# Patient Record
Sex: Female | Born: 1980 | Hispanic: No | Marital: Married | Smoking: Never smoker
Health system: Southern US, Community
[De-identification: ages and names within clinical notes are randomized; demographics above are authoritative.]

## PROBLEM LIST (undated history)

## (undated) DIAGNOSIS — R7303 Prediabetes: Secondary | ICD-10-CM

## (undated) DIAGNOSIS — E559 Vitamin D deficiency, unspecified: Secondary | ICD-10-CM

## (undated) DIAGNOSIS — Z1231 Encounter for screening mammogram for malignant neoplasm of breast: Secondary | ICD-10-CM

## (undated) DIAGNOSIS — Z1159 Encounter for screening for other viral diseases: Principal | ICD-10-CM

## (undated) DIAGNOSIS — E538 Deficiency of other specified B group vitamins: Secondary | ICD-10-CM

## (undated) DIAGNOSIS — I10 Essential (primary) hypertension: Secondary | ICD-10-CM

---

## 2008-05-11 ENCOUNTER — Ambulatory Visit: Payer: Self-pay | Admitting: *Deleted

## 2008-05-11 ENCOUNTER — Ambulatory Visit: Payer: Self-pay | Admitting: Obstetrics and Gynecology

## 2008-05-11 ENCOUNTER — Inpatient Hospital Stay (HOSPITAL_COMMUNITY): Admission: AD | Admit: 2008-05-11 | Discharge: 2008-05-15 | Payer: Self-pay | Admitting: Obstetrics

## 2008-05-11 ENCOUNTER — Inpatient Hospital Stay (HOSPITAL_COMMUNITY): Admission: AD | Admit: 2008-05-11 | Discharge: 2008-05-11 | Payer: Self-pay | Admitting: Obstetrics

## 2011-04-30 NOTE — Op Note (Signed)
Chelsea Lawrence, Chelsea Lawrence             ACCOUNT NO.:  [id number]   MEDICAL RECORD NO.:  [id number]          PATIENT TYPE:  INP   LOCATION:  9130                          FACILITY:  [facility]   PHYSICIAN:  Tilda Burrow, M.D. DATE OF BIRTH:  10/10/1981   DATE OF PROCEDURE:  05/12/2008  DATE OF DISCHARGE:                               OPERATIVE REPORT   PREOPERATIVE DIAGNOSES:  1. Pregnancy at 39 weeks.  2. Active labor.  3. Cephalopelvic disproportion.   POSTOPERATIVE DIAGNOSES:  1. Pregnancy at 39 weeks, delivered.  2. Active labor.  3. Cephalopelvic disproportion.   PROCEDURE:  Primary low transverse cervical cesarean section.   SURGEON:  Tilda Burrow, MD   ASSISTANT:  None.   ANESTHESIA:  Epidural.   COMPLICATIONS:  None.   FINDINGS:  A healthy female infant, Apgars 9 and 9.  Weight __________ .   INDICATIONS:  This primiparous female was admitted midday on the 27th  with cervical dilation of 5 cm, 100%, 0 station, progressed to  completely dilated, was given several hours to push the baby without  significant progress.  Epidural slide was tried as well as active  pushing and brought the baby down.  It was noted that the ischial spines  were extremely prominent with the bispinous diameter estimated at about  8 cm.   DETAILS OF PROCEDURE:  The patient was taken to the operating room.  Time-out was completed.  IV Ancef administered and transverse uterine  incision performed in a standard method of Pfannenstiel technique.  Peritoneal cavity was entered.  Bladder flap was quite high on the lower  uterine segment.  Transverse incision was made above the bladder,  extended laterally using index finger traction and the fetal vertex  rotated from the transverse position into the incision, and the baby  delivered with fundal pressure.  Cord was clamped and the infant was  passed to pediatricians who were in attendance.  Baby was in good shape.  Cord blood sample was obtained and  the placenta delivered intact,  Schultz presentation, membrane remnants required individual teasing out.  The uterus was inspected and incision looked hemostatic except for some  varicosities on the left side.  The left angle of the incision was  closed with an interrupted suture and tagged, and then the entire  uterine incision closed using running locking 0 chromic first layer and  running continuous 0 chromic second layer.  The patient tolerated the  procedure well and began to ask for food even this early in the case.  Bladder flap was subsequently loosely reapproximated with two  interrupted sutures of 2-0 chromic.  The anterior peritoneum closed with  running 2-0 chromic, the pyramidalis muscles loosely reapproximated with  interrupted suture of 2-0 chromic.  The fascia was then closed using  running 0  Vicryl and subcutaneous tissues inspected and confirmed as hemostatic,  reapproximated with three interrupted sutures of 2-0 plain and staple  closure of the skin completed the procedure.  Sponge and needle counts  were correct.  EBL 600 mL.      Tilda Burrow, M.D.  Electronically  Signed     JVF/MEDQ  D:  05/12/2008  T:  05/12/2008  Job:  [id number]

## 2011-09-11 LAB — CBC
HCT: 28.5 — ABNORMAL LOW
Hemoglobin: 13.9
Hemoglobin: 9.5 — ABNORMAL LOW
Hemoglobin: 9.5 — ABNORMAL LOW
MCHC: 33.3
MCV: 86.7
RBC: 3.29 — ABNORMAL LOW
RBC: 3.29 — ABNORMAL LOW
RBC: 4.82
WBC: 10.8 — ABNORMAL HIGH
WBC: 11.8 — ABNORMAL HIGH

## 2011-09-11 LAB — RPR: RPR Ser Ql: NONREACTIVE

## 2011-09-11 LAB — PLATELET COUNT: Platelets: 104 — ABNORMAL LOW

## 2011-11-26 LAB — OB RESULTS CONSOLE GC/CHLAMYDIA: Gonorrhea: NEGATIVE

## 2011-11-26 LAB — OB RESULTS CONSOLE ABO/RH

## 2011-11-26 LAB — OB RESULTS CONSOLE HEPATITIS B SURFACE ANTIGEN: Hepatitis B Surface Ag: NEGATIVE

## 2011-11-26 LAB — OB RESULTS CONSOLE RUBELLA ANTIBODY, IGM: Rubella: IMMUNE

## 2011-11-26 LAB — OB RESULTS CONSOLE HIV ANTIBODY (ROUTINE TESTING): HIV: NONREACTIVE

## 2012-07-17 ENCOUNTER — Encounter (HOSPITAL_COMMUNITY): Payer: Self-pay | Admitting: *Deleted

## 2012-07-17 ENCOUNTER — Inpatient Hospital Stay (HOSPITAL_COMMUNITY)
Admission: AD | Admit: 2012-07-17 | Discharge: 2012-07-17 | Disposition: A | Discharge status: Home / Self Care | Payer: Medicaid Other | Source: Ambulatory Visit | Attending: Obstetrics & Gynecology | Admitting: Obstetrics & Gynecology

## 2012-07-17 DIAGNOSIS — O479 False labor, unspecified: Secondary | ICD-10-CM | POA: Insufficient documentation

## 2012-07-17 DIAGNOSIS — O4690 Antepartum hemorrhage, unspecified, unspecified trimester: Secondary | ICD-10-CM | POA: Insufficient documentation

## 2012-07-17 NOTE — MAU Note (Signed)
Woke up this morning with bleeding.  Husband states not in labor, just bleeding.  2nd preg.  edc 08/05.  Was checked on Wed, was 1 cm.

## 2012-07-17 NOTE — MAU Note (Signed)
Little bit contractions.  Blood noted at 0500, spot now when went to bathroom, was mixed with mucous, passed a glob of blood.  Hx of c/s- labor was too long. Plan for trial of labor

## 2012-07-17 NOTE — MAU Note (Signed)
Initial US showed post complete previa.  No further comment noted on prenatal chart, regarding placenta.  Ongoing discussion with pt noted on chart regarding TOLAC.

## 2012-07-18 ENCOUNTER — Encounter (HOSPITAL_COMMUNITY)
Admission: AD | Disposition: A | Discharge status: Home / Self Care | Payer: Self-pay | Source: Ambulatory Visit | Attending: Obstetrics & Gynecology

## 2012-07-18 ENCOUNTER — Encounter (HOSPITAL_COMMUNITY): Payer: Self-pay | Admitting: Anesthesiology

## 2012-07-18 ENCOUNTER — Encounter (HOSPITAL_COMMUNITY): Payer: Self-pay | Admitting: *Deleted

## 2012-07-18 ENCOUNTER — Inpatient Hospital Stay (HOSPITAL_COMMUNITY)
Admission: AD | Admit: 2012-07-18 | Discharge: 2012-07-21 | DRG: 766 | Disposition: A | Discharge status: Home / Self Care | Payer: Medicaid Other | Source: Ambulatory Visit | Attending: Obstetrics & Gynecology | Admitting: Obstetrics & Gynecology

## 2012-07-18 ENCOUNTER — Inpatient Hospital Stay (HOSPITAL_COMMUNITY): Payer: Medicaid Other | Admitting: Anesthesiology

## 2012-07-18 DIAGNOSIS — Z302 Encounter for sterilization: Secondary | ICD-10-CM

## 2012-07-18 DIAGNOSIS — O34219 Maternal care for unspecified type scar from previous cesarean delivery: Secondary | ICD-10-CM | POA: Diagnosis present

## 2012-07-18 DIAGNOSIS — Z3480 Encounter for supervision of other normal pregnancy, unspecified trimester: Secondary | ICD-10-CM

## 2012-07-18 LAB — CBC
MCV: 84.7 fL (ref 78.0–100.0)
MCV: 85.3 fL (ref 78.0–100.0)
Platelets: 122 10*3/uL — ABNORMAL LOW (ref 150–400)
Platelets: 116 10*3/uL — ABNORMAL LOW (ref 150–400)
RBC: 4.37 MIL/uL (ref 3.87–5.11)
RBC: 3.89 MIL/uL (ref 3.87–5.11)
WBC: 10.5 10*3/uL (ref 4.0–10.5)
WBC: 10.5 10*3/uL (ref 4.0–10.5)

## 2012-07-18 LAB — RPR: RPR Ser Ql: NONREACTIVE

## 2012-07-18 SURGERY — Surgical Case
Anesthesia: Regional | Site: Abdomen | Wound class: Clean Contaminated

## 2012-07-18 MED ORDER — LACTATED RINGERS IV SOLN
500.0000 mL | INTRAVENOUS | Status: DC | PRN
  Administered 2012-07-18: 500 mL via INTRAVENOUS

## 2012-07-18 MED ORDER — ONDANSETRON HCL 4 MG/2ML IJ SOLN: 4.0000 mg | Freq: Four times a day (QID) | INTRAMUSCULAR | Status: DC | PRN

## 2012-07-18 MED ORDER — METOCLOPRAMIDE HCL 5 MG/ML IJ SOLN: 10.0000 mg | Freq: Three times a day (TID) | INTRAMUSCULAR | Status: DC | PRN

## 2012-07-18 MED ORDER — LACTATED RINGERS IV SOLN
INTRAVENOUS | Status: DC
  Administered 2012-07-18 (×4): via INTRAVENOUS

## 2012-07-18 MED ORDER — TERBUTALINE SULFATE 1 MG/ML IJ SOLN: 0.2500 mg | Freq: Once | INTRAMUSCULAR | Status: DC | PRN

## 2012-07-18 MED ORDER — SENNOSIDES-DOCUSATE SODIUM 8.6-50 MG PO TABS
2.0000 | ORAL_TABLET | Freq: Every day | ORAL | Status: DC
  Administered 2012-07-19 – 2012-07-20 (×2): 2 via ORAL

## 2012-07-18 MED ORDER — FENTANYL 2.5 MCG/ML BUPIVACAINE 1/10 % EPIDURAL INFUSION (WH - ANES)
INTRAMUSCULAR | Status: DC | PRN
Start: 2012-07-18 — End: 2012-07-18
  Administered 2012-07-18: 14 mL/h via EPIDURAL

## 2012-07-18 MED ORDER — KETOROLAC TROMETHAMINE 60 MG/2ML IM SOLN
INTRAMUSCULAR | Status: AC
  Administered 2012-07-18: 60 mg via INTRAMUSCULAR
  Filled 2012-07-18: qty 2

## 2012-07-18 MED ORDER — PHENYLEPHRINE 40 MCG/ML (10ML) SYRINGE FOR IV PUSH (FOR BLOOD PRESSURE SUPPORT): 80.0000 ug | PREFILLED_SYRINGE | INTRAVENOUS | Status: DC | PRN

## 2012-07-18 MED ORDER — IBUPROFEN 600 MG PO TABS
600.0000 mg | ORAL_TABLET | Freq: Four times a day (QID) | ORAL | Status: DC
  Administered 2012-07-18 – 2012-07-21 (×11): 600 mg via ORAL
  Filled 2012-07-18 (×11): qty 1

## 2012-07-18 MED ORDER — FENTANYL 2.5 MCG/ML BUPIVACAINE 1/10 % EPIDURAL INFUSION (WH - ANES)
14.0000 mL/h | INTRAMUSCULAR | Status: DC
  Administered 2012-07-18: 14 mL/h via EPIDURAL
  Filled 2012-07-18 (×2): qty 60

## 2012-07-18 MED ORDER — SCOPOLAMINE 1 MG/3DAYS TD PT72
1.0000 | MEDICATED_PATCH | Freq: Once | TRANSDERMAL | Status: AC
  Administered 2012-07-18: 1.5 mg via TRANSDERMAL

## 2012-07-18 MED ORDER — ZOLPIDEM TARTRATE 5 MG PO TABS: 5.0000 mg | ORAL_TABLET | Freq: Every evening | ORAL | Status: DC | PRN

## 2012-07-18 MED ORDER — PHENYLEPHRINE 40 MCG/ML (10ML) SYRINGE FOR IV PUSH (FOR BLOOD PRESSURE SUPPORT)
PREFILLED_SYRINGE | INTRAVENOUS | Status: AC
  Filled 2012-07-18: qty 5

## 2012-07-18 MED ORDER — OXYCODONE-ACETAMINOPHEN 5-325 MG PO TABS: 1.0000 | ORAL_TABLET | ORAL | Status: DC | PRN

## 2012-07-18 MED ORDER — LACTATED RINGERS IV SOLN
500.0000 mL | Freq: Once | INTRAVENOUS | Status: DC
Start: 2012-07-18 — End: 2012-07-18

## 2012-07-18 MED ORDER — SODIUM CHLORIDE 0.9 % IV SOLN: 1.0000 ug/kg/h | INTRAVENOUS | Status: DC | PRN

## 2012-07-18 MED ORDER — TETANUS-DIPHTH-ACELL PERTUSSIS 5-2.5-18.5 LF-MCG/0.5 IM SUSP: 0.5000 mL | Freq: Once | INTRAMUSCULAR | Status: DC

## 2012-07-18 MED ORDER — LIDOCAINE-EPINEPHRINE (PF) 2 %-1:200000 IJ SOLN
INTRAMUSCULAR | Status: AC
  Filled 2012-07-18: qty 20

## 2012-07-18 MED ORDER — OXYTOCIN 10 UNIT/ML IJ SOLN
40.0000 [IU] | INTRAVENOUS | Status: DC | PRN
  Administered 2012-07-18: 40 [IU] via INTRAVENOUS

## 2012-07-18 MED ORDER — KETOROLAC TROMETHAMINE 60 MG/2ML IM SOLN
60.0000 mg | Freq: Once | INTRAMUSCULAR | Status: AC | PRN
  Administered 2012-07-18: 60 mg via INTRAMUSCULAR

## 2012-07-18 MED ORDER — IBUPROFEN 600 MG PO TABS: 600.0000 mg | ORAL_TABLET | Freq: Four times a day (QID) | ORAL | Status: DC | PRN

## 2012-07-18 MED ORDER — WITCH HAZEL-GLYCERIN EX PADS: 1.0000 "application " | MEDICATED_PAD | CUTANEOUS | Status: DC | PRN

## 2012-07-18 MED ORDER — MEPERIDINE HCL 25 MG/ML IJ SOLN: 6.2500 mg | INTRAMUSCULAR | Status: DC | PRN

## 2012-07-18 MED ORDER — ONDANSETRON HCL 4 MG/2ML IJ SOLN
INTRAMUSCULAR | Status: AC
  Filled 2012-07-18: qty 2

## 2012-07-18 MED ORDER — ACETAMINOPHEN 325 MG PO TABS: 650.0000 mg | ORAL_TABLET | ORAL | Status: DC | PRN

## 2012-07-18 MED ORDER — DIPHENHYDRAMINE HCL 25 MG PO CAPS: 25.0000 mg | ORAL_CAPSULE | ORAL | Status: DC | PRN

## 2012-07-18 MED ORDER — MENTHOL 3 MG MT LOZG: 1.0000 | LOZENGE | OROMUCOSAL | Status: DC | PRN

## 2012-07-18 MED ORDER — SODIUM CHLORIDE 0.9 % IR SOLN
Status: DC | PRN
  Administered 2012-07-18: 1000 mL

## 2012-07-18 MED ORDER — EPHEDRINE 5 MG/ML INJ: 10.0000 mg | INTRAVENOUS | Status: DC | PRN

## 2012-07-18 MED ORDER — EPHEDRINE 5 MG/ML INJ
10.0000 mg | INTRAVENOUS | Status: DC | PRN
  Filled 2012-07-18: qty 4

## 2012-07-18 MED ORDER — PRENATAL MULTIVITAMIN CH
1.0000 | ORAL_TABLET | Freq: Every day | ORAL | Status: DC
  Administered 2012-07-19 – 2012-07-21 (×3): 1 via ORAL
  Filled 2012-07-18 (×3): qty 1

## 2012-07-18 MED ORDER — KETOROLAC TROMETHAMINE 30 MG/ML IJ SOLN: 30.0000 mg | Freq: Four times a day (QID) | INTRAMUSCULAR | Status: AC | PRN

## 2012-07-18 MED ORDER — MEPERIDINE HCL 25 MG/ML IJ SOLN
INTRAMUSCULAR | Status: DC | PRN
  Administered 2012-07-18: 25 mg via INTRAVENOUS

## 2012-07-18 MED ORDER — OXYTOCIN BOLUS FROM INFUSION
250.0000 mL | Freq: Once | INTRAVENOUS | Status: DC
  Filled 2012-07-18: qty 500

## 2012-07-18 MED ORDER — OXYCODONE-ACETAMINOPHEN 5-325 MG PO TABS
1.0000 | ORAL_TABLET | ORAL | Status: DC | PRN
  Administered 2012-07-19 – 2012-07-21 (×6): 1 via ORAL
  Filled 2012-07-18 (×7): qty 1

## 2012-07-18 MED ORDER — DIPHENHYDRAMINE HCL 50 MG/ML IJ SOLN: 25.0000 mg | INTRAMUSCULAR | Status: DC | PRN

## 2012-07-18 MED ORDER — OXYTOCIN 40 UNITS IN LACTATED RINGERS INFUSION - SIMPLE MED
1.0000 m[IU]/min | INTRAVENOUS | Status: DC
  Administered 2012-07-18: 2 m[IU]/min via INTRAVENOUS

## 2012-07-18 MED ORDER — LIDOCAINE HCL (PF) 1 % IJ SOLN: 30.0000 mL | INTRAMUSCULAR | Status: DC | PRN

## 2012-07-18 MED ORDER — ONDANSETRON HCL 4 MG/2ML IJ SOLN
INTRAMUSCULAR | Status: DC | PRN
  Administered 2012-07-18: 4 mg via INTRAVENOUS

## 2012-07-18 MED ORDER — LIDOCAINE-EPINEPHRINE (PF) 2 %-1:200000 IJ SOLN
INTRAMUSCULAR | Status: DC | PRN
  Administered 2012-07-18: 4 mL via EPIDURAL

## 2012-07-18 MED ORDER — LACTATED RINGERS IV SOLN
INTRAVENOUS | Status: DC
  Administered 2012-07-18: 125 mL/h via INTRAVENOUS

## 2012-07-18 MED ORDER — SODIUM CHLORIDE 0.9 % IJ SOLN: 3.0000 mL | INTRAMUSCULAR | Status: DC | PRN

## 2012-07-18 MED ORDER — MORPHINE SULFATE 0.5 MG/ML IJ SOLN
INTRAMUSCULAR | Status: AC
  Filled 2012-07-18: qty 10

## 2012-07-18 MED ORDER — NALBUPHINE HCL 10 MG/ML IJ SOLN
5.0000 mg | INTRAMUSCULAR | Status: DC | PRN
  Filled 2012-07-18: qty 1

## 2012-07-18 MED ORDER — SODIUM BICARBONATE 8.4 % IV SOLN
INTRAVENOUS | Status: AC
  Filled 2012-07-18: qty 50

## 2012-07-18 MED ORDER — OXYTOCIN 40 UNITS IN LACTATED RINGERS INFUSION - SIMPLE MED
62.5000 mL/h | Freq: Once | INTRAVENOUS | Status: DC
  Filled 2012-07-18: qty 1000

## 2012-07-18 MED ORDER — OXYTOCIN 40 UNITS IN LACTATED RINGERS INFUSION - SIMPLE MED
62.5000 mL/h | INTRAVENOUS | Status: AC
  Administered 2012-07-18: 62.5 mL/h via INTRAVENOUS
  Filled 2012-07-18 (×2): qty 1000

## 2012-07-18 MED ORDER — SIMETHICONE 80 MG PO CHEW
80.0000 mg | CHEWABLE_TABLET | ORAL | Status: DC | PRN
  Administered 2012-07-19: 80 mg via ORAL

## 2012-07-18 MED ORDER — MEPERIDINE HCL 25 MG/ML IJ SOLN
INTRAMUSCULAR | Status: AC
  Filled 2012-07-18: qty 1

## 2012-07-18 MED ORDER — LANOLIN HYDROUS EX OINT: 1.0000 "application " | TOPICAL_OINTMENT | CUTANEOUS | Status: DC | PRN

## 2012-07-18 MED ORDER — SCOPOLAMINE 1 MG/3DAYS TD PT72
MEDICATED_PATCH | TRANSDERMAL | Status: AC
  Filled 2012-07-18: qty 1

## 2012-07-18 MED ORDER — ONDANSETRON HCL 4 MG PO TABS: 4.0000 mg | ORAL_TABLET | ORAL | Status: DC | PRN

## 2012-07-18 MED ORDER — PHENYLEPHRINE HCL 10 MG/ML IJ SOLN
INTRAMUSCULAR | Status: DC | PRN
  Administered 2012-07-18: 40 ug via INTRAVENOUS
  Administered 2012-07-18 (×4): 80 ug via INTRAVENOUS
  Administered 2012-07-18 (×3): 40 ug via INTRAVENOUS

## 2012-07-18 MED ORDER — FLEET ENEMA 7-19 GM/118ML RE ENEM: 1.0000 | ENEMA | RECTAL | Status: DC | PRN

## 2012-07-18 MED ORDER — PHENYLEPHRINE 40 MCG/ML (10ML) SYRINGE FOR IV PUSH (FOR BLOOD PRESSURE SUPPORT)
80.0000 ug | PREFILLED_SYRINGE | INTRAVENOUS | Status: DC | PRN
  Filled 2012-07-18: qty 5

## 2012-07-18 MED ORDER — FENTANYL CITRATE 0.05 MG/ML IJ SOLN: 25.0000 ug | INTRAMUSCULAR | Status: DC | PRN

## 2012-07-18 MED ORDER — MORPHINE SULFATE (PF) 0.5 MG/ML IJ SOLN
INTRAMUSCULAR | Status: DC | PRN
  Administered 2012-07-18: 4 mg via EPIDURAL
  Administered 2012-07-18: 1 mg via INTRAVENOUS

## 2012-07-18 MED ORDER — DIBUCAINE 1 % RE OINT: 1.0000 "application " | TOPICAL_OINTMENT | RECTAL | Status: DC | PRN

## 2012-07-18 MED ORDER — CITRIC ACID-SODIUM CITRATE 334-500 MG/5ML PO SOLN
30.0000 mL | ORAL | Status: DC | PRN
  Administered 2012-07-18: 30 mL via ORAL
  Filled 2012-07-18 (×2): qty 15

## 2012-07-18 MED ORDER — NALOXONE HCL 0.4 MG/ML IJ SOLN: 0.4000 mg | INTRAMUSCULAR | Status: DC | PRN

## 2012-07-18 MED ORDER — ONDANSETRON HCL 4 MG/2ML IJ SOLN: 4.0000 mg | INTRAMUSCULAR | Status: DC | PRN

## 2012-07-18 MED ORDER — CEFAZOLIN SODIUM-DEXTROSE 2-3 GM-% IV SOLR
2.0000 g | INTRAVENOUS | Status: AC
  Administered 2012-07-18: 2 g via INTRAVENOUS
  Filled 2012-07-18: qty 50

## 2012-07-18 MED ORDER — DIPHENHYDRAMINE HCL 50 MG/ML IJ SOLN: 12.5000 mg | INTRAMUSCULAR | Status: DC | PRN

## 2012-07-18 MED ORDER — BUTORPHANOL TARTRATE 1 MG/ML IJ SOLN: 1.0000 mg | INTRAMUSCULAR | Status: DC | PRN

## 2012-07-18 MED ORDER — DIPHENHYDRAMINE HCL 25 MG PO CAPS: 25.0000 mg | ORAL_CAPSULE | Freq: Four times a day (QID) | ORAL | Status: DC | PRN

## 2012-07-18 MED ORDER — ONDANSETRON HCL 4 MG/2ML IJ SOLN: 4.0000 mg | Freq: Three times a day (TID) | INTRAMUSCULAR | Status: DC | PRN

## 2012-07-18 MED ORDER — SIMETHICONE 80 MG PO CHEW
80.0000 mg | CHEWABLE_TABLET | Freq: Three times a day (TID) | ORAL | Status: DC
  Administered 2012-07-19 – 2012-07-21 (×8): 80 mg via ORAL

## 2012-07-18 MED ORDER — CEFAZOLIN SODIUM-DEXTROSE 2-3 GM-% IV SOLR
INTRAVENOUS | Status: AC
  Filled 2012-07-18: qty 50

## 2012-07-18 MED ORDER — OXYTOCIN 10 UNIT/ML IJ SOLN
INTRAMUSCULAR | Status: AC
  Filled 2012-07-18: qty 4

## 2012-07-18 SURGICAL SUPPLY — 33 items
CLIP FILSHIE TUBAL LIGA STRL (Clip) ×2 IMPLANT
CLOTH BEACON ORANGE TIMEOUT ST (SAFETY) ×2 IMPLANT
CONTAINER PREFILL 10% NBF 15ML (MISCELLANEOUS) IMPLANT
DRSG COVADERM 4X10 (GAUZE/BANDAGES/DRESSINGS) ×2 IMPLANT
ELECT REM PT RETURN 9FT ADLT (ELECTROSURGICAL) ×2
ELECTRODE REM PT RTRN 9FT ADLT (ELECTROSURGICAL) ×1 IMPLANT
EXTRACTOR VACUUM M CUP 4 TUBE (SUCTIONS) IMPLANT
GLOVE ECLIPSE 6.0 STRL STRAW (GLOVE) ×2 IMPLANT
GLOVE ECLIPSE 6.5 STRL STRAW (GLOVE) ×2 IMPLANT
GOWN PREVENTION PLUS LG XLONG (DISPOSABLE) ×6 IMPLANT
KIT ABG SYR 3ML LUER SLIP (SYRINGE) IMPLANT
NEEDLE HYPO 25X5/8 SAFETYGLIDE (NEEDLE) IMPLANT
NS IRRIG 1000ML POUR BTL (IV SOLUTION) ×2 IMPLANT
PACK C SECTION WH (CUSTOM PROCEDURE TRAY) ×2 IMPLANT
PAD ABD 7.5X8 STRL (GAUZE/BANDAGES/DRESSINGS) ×2 IMPLANT
RTRCTR C-SECT PINK 25CM LRG (MISCELLANEOUS) ×2 IMPLANT
SLEEVE SCD COMPRESS KNEE MED (MISCELLANEOUS) ×2 IMPLANT
STAPLER VISISTAT 35W (STAPLE) ×2 IMPLANT
SUT PLAIN 0 NONE (SUTURE) IMPLANT
SUT VIC AB 0 CT1 27 (SUTURE) ×3
SUT VIC AB 0 CT1 27XBRD ANBCTR (SUTURE) ×3 IMPLANT
SUT VIC AB 1 CTX 36 (SUTURE) ×2
SUT VIC AB 1 CTX36XBRD ANBCTRL (SUTURE) ×2 IMPLANT
SUT VIC AB 3-0 CT1 27 (SUTURE) ×1
SUT VIC AB 3-0 CT1 TAPERPNT 27 (SUTURE) ×1 IMPLANT
SUT VIC AB 3-0 PS2 18 (SUTURE)
SUT VIC AB 3-0 PS2 18XBRD (SUTURE) IMPLANT
SUT VIC AB 3-0 SH 27 (SUTURE)
SUT VIC AB 3-0 SH 27X BRD (SUTURE) IMPLANT
TAPE CLOTH SURG 4X10 WHT LF (GAUZE/BANDAGES/DRESSINGS) ×2 IMPLANT
TOWEL OR 17X24 6PK STRL BLUE (TOWEL DISPOSABLE) ×4 IMPLANT
TRAY FOLEY CATH 14FR (SET/KITS/TRAYS/PACK) IMPLANT
WATER STERILE IRR 1000ML POUR (IV SOLUTION) ×2 IMPLANT

## 2012-07-18 NOTE — OR Nursing (Addendum)
Uterus massaged by S. Keeghan Mcintire Charity fundraiser. Two tubes of cord blood to lab. Foley catheter in place upon arrival to OR. Urine color-blood tinged.  25cc of blood evacuated from uterus during uterine massage.  Filshie clips applied to right and left fallopian tubes by Dr. Darrel Hoover. Lot number [id number]. Expires on 11/2014.  [employer]

## 2012-07-18 NOTE — Anesthesia Postprocedure Evaluation (Signed)
Anesthesia Post Note  Patient: Chelsea Lawrence  Procedure(s) Performed: Procedure(s) (LRB): CESAREAN SECTION (N/A)  Anesthesia type: Epidural  Patient location: PACU  Post pain: Pain level controlled  Post assessment: Post-op Vital signs reviewed  Last Vitals:  Filed Vitals:   07/18/12 1500  BP: 99/67  Pulse: 85  Temp:   Resp: 22    Post vital signs: stable  Level of consciousness: awake  Complications: No apparent anesthesia complications

## 2012-07-18 NOTE — Op Note (Addendum)
Patient Name: Chelsea Lawrence MRN: [id number]  Date of Surgery: 07/18/2012    PREOPERATIVE DIAGNOSIS: Failure to progress/ desires sterilization  POSTOPERATIVE DIAGNOSIS: Failure to progress/desires sterilization   PROCEDURE: Low transverse cesarean section  SURGEON: Caralyn Guile. Arlyce Dice M.D.  ANESTHESIA: Epidural  ESTIMATED BLOOD LOSS: 800 ml  FINDINGS: Female, Apgar 9,9; weight 7 lbs. 14 oz.; Clear fluid, normal adnexa   INDICATIONS: Failed TOLAC - 2nd stage arrest.  PROCEDURE IN DETAIL: The patient was taken to the operating room and the epidural that had been placed in labor was reinjected for surgical anesthesia.  She was then placed in the supine position with left lateral displacement of the uterus. The abdomen was prepped and draped in a sterile fashion and the bladder was catheterized.  A low transverse abdominal incision was made and carried down to the fascia. The fascia was opened transversely and the rectus sheath was dissected from the underlying rectus muscle. The rectus midline was identified and opened by sharp and blunt dissection. The peritoneum was opened. An Alexis retractor was placed and the lower uterine segment was identified, entered transversely by careful sharp dissection, and extended bluntly.  The infant was delivered without difficulty. The placenta was delivered with uterine message and cord traction and the uterus was bluntly curettaged. The lower segment was closed with running interlocking Vicryl 1 suture.  The left fallopian tube was identified and elevated with a Babcock clamp.  A Filshie clip was applied to the isthmic/ampullary portion.  The identical procedure was performed on the right tube to effect sterilization.  The peritoneum and rectus muscle were closed in the midline with running 3-0 Vicryl suture. The fascia was closed with running 0 Vicryl suture and the skin was closed with staples. All sponge and instrument counts were correct.  The patient  tolerated the procedure well and left the operating room in good condition.

## 2012-07-18 NOTE — Anesthesia Preprocedure Evaluation (Addendum)
Anesthesia Evaluation  Patient identified by MRN, date of birth, ID band Patient awake    Reviewed: Allergy & Precautions, H&P , Patient's Chart, lab work & pertinent test results  Airway Mallampati: II  TM Distance: >3 FB Neck ROM: full    Dental  (+) Teeth Intact   Pulmonary  breath sounds clear to auscultation        Cardiovascular Rhythm:regular Rate:Normal     Neuro/Psych    GI/Hepatic   Endo/Other    Renal/GU      Musculoskeletal   Abdominal   Peds  Hematology   Anesthesia Other Findings TOLAC      Reproductive/Obstetrics (+) Pregnancy                            Anesthesia Physical Anesthesia Plan  ASA: II  Anesthesia Plan: Epidural   Post-op Pain Management:    Induction:   Airway Management Planned:   Additional Equipment:   Intra-op Plan:   Post-operative Plan:   Informed Consent: I have reviewed the patients History and Physical, chart, labs and discussed the procedure including the risks, benefits and alternatives for the proposed anesthesia with the patient or authorized representative who has indicated his/her understanding and acceptance.   Dental Advisory Given  Plan Discussed with:   Anesthesia Plan Comments: (Labs checked- platelets confirmed with RN in room. Fetal heart tracing, per RN, reported to be stable enough for sitting procedure. Discussed epidural, and patient consents to the procedure:  included risk of possible headache,backache, failed block, allergic reaction, and nerve injury. This patient was asked if she had any questions or concerns before the procedure started.)        Anesthesia Quick Evaluation  

## 2012-07-18 NOTE — H&P (Signed)
31 y.o. G2P1001  Estimated Date of Delivery: 07/20/12 admitted at 39/[redacted] weeks gestation in early labor and SROM.  Prenatal Transfer Tool  Maternal Diabetes: No Genetic Screening: Normal Maternal Ultrasounds/Referrals: Normal Fetal Ultrasounds or other Referrals:  None Maternal Substance Abuse:  No Significant Maternal Medications:  None Significant Maternal Lab Results: None Other Significant Pregnancy Complications:  Previous cesarean delivery  Afebrile, VSS Heart and Lungs: No active disease Abdomen: soft, gravid, EFW AGA. Cervical exam:  2/70  Impression: TOLAC  Plan:  Attempt VBAC

## 2012-07-18 NOTE — Anesthesia Procedure Notes (Signed)

## 2012-07-18 NOTE — Transfer of Care (Signed)
Immediate Anesthesia Transfer of Care Note  Patient: Chelsea Lawrence  Procedure(s) Performed: Procedure(s) (LRB): CESAREAN SECTION (N/A)  Patient Location: PACU  Anesthesia Type: Epidural  Level of Consciousness: awake and alert   Airway & Oxygen Therapy: Patient Spontanous Breathing  Post-op Assessment: Report given to PACU RN  Post vital signs: Reviewed  Complications: No apparent anesthesia complications

## 2012-07-18 NOTE — Progress Notes (Signed)
Patient progressed rapidly to complete dilatation and has been pushing for 2 hrs and 15 minutes.  The vertex is +1/+2 and LOA.  She has not made much progress by my exam over the last hour.  It feels like her pubic arch is narrow.  Imp:  Previous C/S for second stage arrest.  I am not sure an attempt at operative delivery would be successful.  Pt desires sterilization.  I have told the pt that I feel repeat cesarean would be safest for mother and baby.  Plan:  Repeat C/S and Tubal sterilization.

## 2012-07-18 NOTE — MAU Note (Signed)
Pt states, " I had a gush of water leak out at 0020, and then my contractions got stronger and about every 3-5 minutes."

## 2012-07-18 NOTE — Progress Notes (Signed)
Requested orders for labor admission and epidrual

## 2012-07-18 NOTE — Preoperative (Signed)
Beta Blockers   Reason not to administer Beta Blockers:Not Applicable 

## 2012-07-19 LAB — CBC
HCT: 27 % — ABNORMAL LOW (ref 36.0–46.0)
MCHC: 32.6 g/dL (ref 30.0–36.0)
MCV: 84.6 fL (ref 78.0–100.0)
Platelets: 102 10*3/uL — ABNORMAL LOW (ref 150–400)
RDW: 17.8 % — ABNORMAL HIGH (ref 11.5–15.5)

## 2012-07-19 MED ORDER — FENTANYL 2.5 MCG/ML BUPIVACAINE 1/10 % EPIDURAL INFUSION (WH - ANES)
INTRAMUSCULAR | Status: AC
  Filled 2012-07-19: qty 60

## 2012-07-19 NOTE — Progress Notes (Signed)
Patient straight cathed using sterile technique resulting in 600 ml urine.

## 2012-07-19 NOTE — Anesthesia Postprocedure Evaluation (Signed)
  Anesthesia Post-op Note  Patient: Chelsea Lawrence  Procedure(s) Performed: Procedure(s) (LRB): CESAREAN SECTION (N/A)  Patient Location: Mother/Baby  Anesthesia Type: Epidural  Level of Consciousness: awake, alert  and oriented  Airway and Oxygen Therapy: Patient Spontanous Breathing  Post-op Pain: mild  Post-op Assessment: Patient's Cardiovascular Status Stable and Respiratory Function Stable  Post-op Vital Signs: Reviewed and stable  Complications: No apparent anesthesia complications

## 2012-07-19 NOTE — Progress Notes (Signed)
Notified Dr Arlyce Dice patient unable to void, he ordered bladder scan. Bladder scan results greater than 641 ml. Will straight cath as ordered.

## 2012-07-19 NOTE — Progress Notes (Signed)
Post Op Day 1 Subjective: Not voiding well - required straight cath at 11 am.  Otherwise patient doing well.  Objective: Blood pressure 82/51, pulse 79, temperature 97.2 F (36.2 C),  breastfeeding.  Physical Exam:  General: alert Lochia: appropriate Uterine Fundus: firm Incision: no significant drainage   Basename 07/19/12 0555 07/18/12 1449  HGB 8.8* 10.5*  HCT 27.0* 33.2*    Assessment/Plan: Plan for discharge tomorrow if patient feels well and voiding normally.   LOS: 1 day   Anaka Beazer D 07/19/2012, 11:00 AM

## 2012-07-19 NOTE — Addendum Note (Signed)
Addendum  created 07/19/12 1008 by Earmon Phoenix, CRNA   Modules edited:Notes Section

## 2012-07-19 NOTE — Progress Notes (Signed)
Notified Dr Arlyce Dice of BP results this morning and hemoglobin results. Patient not dizzy with ambulation, tolerates well.

## 2012-07-20 ENCOUNTER — Encounter (HOSPITAL_COMMUNITY): Payer: Self-pay | Admitting: Obstetrics & Gynecology

## 2012-07-20 NOTE — Progress Notes (Signed)
UR chart review completed.  

## 2012-07-20 NOTE — Progress Notes (Signed)
Subjective: Postpartum Day 2: Cesarean Delivery Patient reports feeling well. Notes more pain today, bleeding improved. No nausea.  Objective: Vital signs in last 24 hours: Filed Vitals:   07/19/12 0936 07/19/12 1437 07/19/12 2144 07/20/12 0607  BP: 82/51 78/51 89/54  98/70  Pulse: 79 98 96 90  Temp:  97.9 F (36.6 C) 98.3 F (36.8 C) 98 F (36.7 C)  TempSrc:   Oral Oral  Resp:  18 18 18   Height:      Weight:      SpO2:        Physical Exam:  General: alert, cooperative and appears stated age Lochia: appropriate Uterine Fundus: firm Incision: dressing intact    Basename 07/19/12 0555 07/18/12 1449  HGB 8.8* 10.5*  HCT 27.0* 33.2*    Assessment/Plan: Status post Cesarean section. Doing well postoperatively.  Continue current care. Encouraged patient to shower and remove bandage Desires D/C home tomorrow   Chelsea Lawrence H. 07/20/2012, 9:33 AM

## 2012-07-21 MED ORDER — OXYCODONE-ACETAMINOPHEN 5-325 MG PO TABS: 1.0000 | ORAL_TABLET | ORAL | Status: AC | PRN

## 2012-07-21 NOTE — Progress Notes (Signed)
POD#3 Pt without complaints. Ready for discharge.

## 2012-07-22 NOTE — Discharge Summary (Signed)
NAMEDOREATHER, HOXWORTH NO.:  [id number]  MEDICAL RECORD NO.:  [id number]  LOCATION:  9103                          FACILITY:  [facility]  PHYSICIAN:  Malva Limes, M.D.    DATE OF BIRTH:  29-Apr-1981  DATE OF ADMISSION:  07/18/2012 DATE OF DISCHARGE:  07/21/2012                              DISCHARGE SUMMARY   PRINCIPAL DISCHARGE DIAGNOSES: 1. Intrauterine pregnancy at term. 2. Spontaneous rupture of membranes. 3. History of prior cesarean section. 4. The patient desires attempted vaginal birth after cesarean section. 5. Patient desires permanent sterilization.  PRINCIPAL PROCEDURES: 1. Repeat low-transverse cesarean section. 2. Bilateral tubal ligation.  HISTORY OF PRESENT ILLNESS:  Ms. Belle is a 31 year old female, G2, P1-0- 0-1 at term, who presented to [facility] with spontaneous rupture of membranes.  The patient's prenatal care was uncomplicated.  The patient completed her first stage without difficulty.  She pushed for 2 hours and 15 minutes.  Despite aggressive pushing, she was unable to make significant progress.  She was taken to the operating room by Dr. Ilda Mori for repeat cesarean section secondary to arrest of second stage.  She also expressed desire for permanent sterilization.  A complete description of the operative procedure can be found in dictated operative note.  The patient's postoperative course was benign.  The patient postop hemoglobin was 8.8.  The patient was discharged on postop day #3.  At that time, she was eating a regular diet.  She was having flatus.  She had adequate pain control.  Her incision appeared to be healing well.  She was sent home with Percocet to take p.r.n. Instructed to follow up in the office in 4 weeks.          ______________________________ Malva Limes, M.D.     MA/MEDQ  D:  07/21/2012  T:  07/22/2012  Job:  [id number]

## 2014-07-27 ENCOUNTER — Encounter (HOSPITAL_COMMUNITY)
Admission: EM | Disposition: A | Discharge status: Home / Self Care | Payer: Self-pay | Source: Home / Self Care | Attending: Orthopedic Surgery

## 2014-07-27 ENCOUNTER — Emergency Department (HOSPITAL_COMMUNITY): Payer: No Typology Code available for payment source

## 2014-07-27 ENCOUNTER — Emergency Department (HOSPITAL_COMMUNITY): Payer: No Typology Code available for payment source | Admitting: Anesthesiology

## 2014-07-27 ENCOUNTER — Encounter (HOSPITAL_COMMUNITY): Payer: Self-pay | Admitting: Emergency Medicine

## 2014-07-27 ENCOUNTER — Ambulatory Visit (HOSPITAL_COMMUNITY)
Admission: EM | Admit: 2014-07-27 | Discharge: 2014-07-29 | Disposition: A | Discharge status: Home / Self Care | Payer: No Typology Code available for payment source | Attending: Orthopedic Surgery | Admitting: Orthopedic Surgery

## 2014-07-27 ENCOUNTER — Encounter (HOSPITAL_COMMUNITY): Payer: No Typology Code available for payment source | Admitting: Anesthesiology

## 2014-07-27 DIAGNOSIS — S8263XB Displaced fracture of lateral malleolus of unspecified fibula, initial encounter for open fracture type I or II: Principal | ICD-10-CM | POA: Insufficient documentation

## 2014-07-27 DIAGNOSIS — S99911A Unspecified injury of right ankle, initial encounter: Secondary | ICD-10-CM

## 2014-07-27 DIAGNOSIS — W1789XA Other fall from one level to another, initial encounter: Secondary | ICD-10-CM | POA: Insufficient documentation

## 2014-07-27 DIAGNOSIS — E861 Hypovolemia: Secondary | ICD-10-CM | POA: Insufficient documentation

## 2014-07-27 DIAGNOSIS — S82209A Unspecified fracture of shaft of unspecified tibia, initial encounter for closed fracture: Secondary | ICD-10-CM | POA: Diagnosis not present

## 2014-07-27 DIAGNOSIS — S82873B Displaced pilon fracture of unspecified tibia, initial encounter for open fracture type I or II: Secondary | ICD-10-CM | POA: Diagnosis present

## 2014-07-27 DIAGNOSIS — S82409A Unspecified fracture of shaft of unspecified fibula, initial encounter for closed fracture: Secondary | ICD-10-CM

## 2014-07-27 HISTORY — PX: ORIF ANKLE FRACTURE: SHX5408

## 2014-07-27 LAB — CBC WITH DIFFERENTIAL/PLATELET
BASOS ABS: 0 10*3/uL (ref 0.0–0.1)
BASOS PCT: 0 % (ref 0–1)
EOS PCT: 1 % (ref 0–5)
Eosinophils Absolute: 0.1 10*3/uL (ref 0.0–0.7)
HEMATOCRIT: 34.1 % — AB (ref 36.0–46.0)
Hemoglobin: 11.2 g/dL — ABNORMAL LOW (ref 12.0–15.0)
Lymphocytes Relative: 41 % (ref 12–46)
Lymphs Abs: 5.4 10*3/uL — ABNORMAL HIGH (ref 0.7–4.0)
MCH: 27.8 pg (ref 26.0–34.0)
MCHC: 32.8 g/dL (ref 30.0–36.0)
MCV: 84.6 fL (ref 78.0–100.0)
MONO ABS: 0.8 10*3/uL (ref 0.1–1.0)
MONOS PCT: 6 % (ref 3–12)
Neutro Abs: 6.8 10*3/uL (ref 1.7–7.7)
Neutrophils Relative %: 52 % (ref 43–77)
Platelets: 335 10*3/uL (ref 150–400)
RBC: 4.03 MIL/uL (ref 3.87–5.11)
RDW: 14.7 % (ref 11.5–15.5)
WBC: 13 10*3/uL — ABNORMAL HIGH (ref 4.0–10.5)

## 2014-07-27 LAB — BASIC METABOLIC PANEL
Anion gap: 16 — ABNORMAL HIGH (ref 5–15)
BUN: 13 mg/dL (ref 6–23)
CALCIUM: 8.3 mg/dL — AB (ref 8.4–10.5)
CO2: 20 mEq/L (ref 19–32)
Chloride: 104 mEq/L (ref 96–112)
Creatinine, Ser: 0.63 mg/dL (ref 0.50–1.10)
GFR calc Af Amer: >90 mL/min (ref >90)
GFR calc non Af Amer: >90 mL/min (ref >90)
Glucose, Bld: 137 mg/dL — ABNORMAL HIGH (ref 70–99)
Potassium: 2.9 mEq/L — CL (ref 3.7–5.3)
Sodium: 140 mEq/L (ref 137–147)

## 2014-07-27 LAB — ABO/RH: ABO/RH(D): A POS

## 2014-07-27 SURGERY — OPEN REDUCTION INTERNAL FIXATION (ORIF) ANKLE FRACTURE
Anesthesia: Regional | Site: Ankle | Laterality: Right

## 2014-07-27 MED ORDER — KETAMINE HCL 10 MG/ML IJ SOLN
2.0000 mg/kg | Freq: Once | INTRAMUSCULAR | Status: DC
  Filled 2014-07-27: qty 10.9

## 2014-07-27 MED ORDER — HYDROMORPHONE HCL PF 1 MG/ML IJ SOLN
0.5000 mg | Freq: Once | INTRAMUSCULAR | Status: AC
  Administered 2014-07-27: 0.5 mg via INTRAVENOUS
  Filled 2014-07-27: qty 1

## 2014-07-27 MED ORDER — BUPIVACAINE-EPINEPHRINE (PF) 0.5% -1:200000 IJ SOLN
INTRAMUSCULAR | Status: DC | PRN
  Administered 2014-07-27: 45 mL via PERINEURAL

## 2014-07-27 MED ORDER — FENTANYL CITRATE 0.05 MG/ML IJ SOLN
INTRAMUSCULAR | Status: DC | PRN
  Administered 2014-07-27 (×2): 25 ug via INTRAVENOUS
  Administered 2014-07-27: 100 ug via INTRAVENOUS

## 2014-07-27 MED ORDER — SODIUM CHLORIDE 0.9 % IV SOLN
Freq: Once | INTRAVENOUS | Status: AC
  Administered 2014-07-27: 1000 mL via INTRAVENOUS

## 2014-07-27 MED ORDER — POTASSIUM CHLORIDE 10 MEQ/100ML IV SOLN
10.0000 meq | INTRAVENOUS | Status: DC
  Administered 2014-07-27 (×3): 10 meq via INTRAVENOUS
  Filled 2014-07-27 (×3): qty 100

## 2014-07-27 MED ORDER — GENTAMICIN SULFATE 40 MG/ML IJ SOLN
INTRAMUSCULAR | Status: DC | PRN
  Administered 2014-07-27 (×2): 80 mg

## 2014-07-27 MED ORDER — VANCOMYCIN HCL 500 MG IV SOLR
INTRAVENOUS | Status: AC
  Filled 2014-07-27: qty 500

## 2014-07-27 MED ORDER — CEFAZOLIN SODIUM-DEXTROSE 2-3 GM-% IV SOLR
INTRAVENOUS | Status: DC | PRN
  Administered 2014-07-27: 2 g via INTRAVENOUS

## 2014-07-27 MED ORDER — MIDAZOLAM HCL 5 MG/5ML IJ SOLN
INTRAMUSCULAR | Status: DC | PRN
  Administered 2014-07-27: 2 mg via INTRAVENOUS

## 2014-07-27 MED ORDER — MIDAZOLAM HCL 2 MG/2ML IJ SOLN
INTRAMUSCULAR | Status: AC
  Filled 2014-07-27: qty 2

## 2014-07-27 MED ORDER — CEFAZOLIN SODIUM-DEXTROSE 2-3 GM-% IV SOLR
INTRAVENOUS | Status: AC
  Filled 2014-07-27: qty 50

## 2014-07-27 MED ORDER — KETAMINE HCL 10 MG/ML IJ SOLN
INTRAMUSCULAR | Status: AC | PRN
  Administered 2014-07-27: 55 mg via INTRAVENOUS
  Administered 2014-07-27: 25 mg via INTRAVENOUS

## 2014-07-27 MED ORDER — VECURONIUM BROMIDE 10 MG IV SOLR
INTRAVENOUS | Status: DC | PRN
  Administered 2014-07-27: 3 mg via INTRAVENOUS

## 2014-07-27 MED ORDER — ARTIFICIAL TEARS OP OINT
TOPICAL_OINTMENT | OPHTHALMIC | Status: DC | PRN
  Administered 2014-07-27: 1 via OPHTHALMIC

## 2014-07-27 MED ORDER — PHENYLEPHRINE HCL 10 MG/ML IJ SOLN
10.0000 mg | INTRAVENOUS | Status: DC | PRN
  Administered 2014-07-27: 25 ug/min via INTRAVENOUS

## 2014-07-27 MED ORDER — PHENYLEPHRINE HCL 10 MG/ML IJ SOLN
INTRAMUSCULAR | Status: DC | PRN
  Administered 2014-07-27: 40 ug via INTRAVENOUS
  Administered 2014-07-27: 80 ug via INTRAVENOUS
  Administered 2014-07-27: 40 ug via INTRAVENOUS
  Administered 2014-07-27 (×3): 80 ug via INTRAVENOUS

## 2014-07-27 MED ORDER — LIDOCAINE HCL (CARDIAC) 20 MG/ML IV SOLN
INTRAVENOUS | Status: DC | PRN
  Administered 2014-07-27: 100 mg via INTRAVENOUS

## 2014-07-27 MED ORDER — ONDANSETRON HCL 4 MG/2ML IJ SOLN
4.0000 mg | Freq: Once | INTRAMUSCULAR | Status: AC
  Administered 2014-07-27: 4 mg via INTRAVENOUS
  Filled 2014-07-27: qty 2

## 2014-07-27 MED ORDER — FENTANYL CITRATE 0.05 MG/ML IJ SOLN
INTRAMUSCULAR | Status: AC
  Filled 2014-07-27: qty 2

## 2014-07-27 MED ORDER — 0.9 % SODIUM CHLORIDE (POUR BTL) OPTIME
TOPICAL | Status: DC | PRN
Start: 2014-07-27 — End: 2014-07-28
  Administered 2014-07-27: 6000 mL
  Administered 2014-07-27 – 2014-07-28 (×4): 1000 mL

## 2014-07-27 MED ORDER — SUCCINYLCHOLINE CHLORIDE 20 MG/ML IJ SOLN
INTRAMUSCULAR | Status: AC
  Filled 2014-07-27: qty 1

## 2014-07-27 MED ORDER — HYDROMORPHONE HCL PF 1 MG/ML IJ SOLN
INTRAMUSCULAR | Status: AC
  Administered 2014-07-27: 0.5 mg
  Filled 2014-07-27: qty 1

## 2014-07-27 MED ORDER — DEXAMETHASONE SODIUM PHOSPHATE 4 MG/ML IJ SOLN
INTRAMUSCULAR | Status: AC
  Filled 2014-07-27: qty 1

## 2014-07-27 MED ORDER — FENTANYL CITRATE 0.05 MG/ML IJ SOLN
INTRAMUSCULAR | Status: AC
  Filled 2014-07-27: qty 5

## 2014-07-27 MED ORDER — ONDANSETRON HCL 4 MG/2ML IJ SOLN
INTRAMUSCULAR | Status: AC
  Filled 2014-07-27: qty 2

## 2014-07-27 MED ORDER — PROPOFOL 10 MG/ML IV BOLUS
INTRAVENOUS | Status: DC | PRN
  Administered 2014-07-27: 150 mg via INTRAVENOUS

## 2014-07-27 MED ORDER — LACTATED RINGERS IV SOLN
INTRAVENOUS | Status: DC | PRN
  Administered 2014-07-27 (×2): via INTRAVENOUS

## 2014-07-27 MED ORDER — FENTANYL CITRATE 0.05 MG/ML IJ SOLN
100.0000 ug | INTRAMUSCULAR | Status: AC
  Administered 2014-07-27: 100 ug via INTRAVENOUS

## 2014-07-27 MED ORDER — SUCCINYLCHOLINE CHLORIDE 20 MG/ML IJ SOLN
INTRAMUSCULAR | Status: DC | PRN
  Administered 2014-07-27: 100 mg via INTRAVENOUS

## 2014-07-27 MED ORDER — SODIUM CHLORIDE 0.9 % IV SOLN
INTRAVENOUS | Status: DC | PRN
  Administered 2014-07-27: 22:00:00 via INTRAVENOUS

## 2014-07-27 MED ORDER — VANCOMYCIN HCL 500 MG IV SOLR
INTRAVENOUS | Status: DC | PRN
  Administered 2014-07-27: 500 mg

## 2014-07-27 MED ORDER — ONDANSETRON HCL 4 MG/2ML IJ SOLN
4.0000 mg | Freq: Once | INTRAMUSCULAR | Status: AC
  Administered 2014-07-27: 4 mg via INTRAVENOUS

## 2014-07-27 MED ORDER — GLYCOPYRROLATE 0.2 MG/ML IJ SOLN
INTRAMUSCULAR | Status: AC
  Filled 2014-07-27: qty 2

## 2014-07-27 MED ORDER — DEXAMETHASONE SODIUM PHOSPHATE 4 MG/ML IJ SOLN
INTRAMUSCULAR | Status: DC | PRN
  Administered 2014-07-27: 4 mg via INTRAVENOUS

## 2014-07-27 MED ORDER — PROPOFOL 10 MG/ML IV BOLUS
INTRAVENOUS | Status: AC
Start: 2014-07-27 — End: 2014-07-27
  Filled 2014-07-27: qty 20

## 2014-07-27 MED ORDER — CEFAZOLIN SODIUM 1-5 GM-% IV SOLN
1.0000 g | Freq: Once | INTRAVENOUS | Status: AC
  Administered 2014-07-27: 1 g via INTRAVENOUS
  Filled 2014-07-27: qty 50

## 2014-07-27 MED ORDER — ROCURONIUM BROMIDE 50 MG/5ML IV SOLN
INTRAVENOUS | Status: AC
  Filled 2014-07-27: qty 1

## 2014-07-27 SURGICAL SUPPLY — 83 items
BANDAGE ELASTIC 4 VELCRO ST LF (GAUZE/BANDAGES/DRESSINGS) ×3 IMPLANT
BANDAGE ELASTIC 6 VELCRO ST LF (GAUZE/BANDAGES/DRESSINGS) ×6 IMPLANT
BIT DRILL QC 2.7 6.3IN  SHORT (BIT) ×2
BIT DRILL QC 2.7 6.3IN SHORT (BIT) ×1 IMPLANT
BLADE SURG 10 STRL SS (BLADE) IMPLANT
BNDG COHESIVE 6X5 TAN STRL LF (GAUZE/BANDAGES/DRESSINGS) IMPLANT
BNDG ESMARK 4X9 LF (GAUZE/BANDAGES/DRESSINGS) ×3 IMPLANT
BNDG GAUZE ELAST 4 BULKY (GAUZE/BANDAGES/DRESSINGS) IMPLANT
COVER MAYO STAND STRL (DRAPES) ×3 IMPLANT
COVER SURGICAL LIGHT HANDLE (MISCELLANEOUS) ×3 IMPLANT
CUFF TOURNIQUET SINGLE 34IN LL (TOURNIQUET CUFF) IMPLANT
CUFF TOURNIQUET SINGLE 44IN (TOURNIQUET CUFF) IMPLANT
DRAPE C-ARM 42X72 X-RAY (DRAPES) ×3 IMPLANT
DRAPE INCISE IOBAN 66X45 STRL (DRAPES) IMPLANT
DRAPE PROXIMA HALF (DRAPES) ×3 IMPLANT
DRAPE SURG 17X23 STRL (DRAPES) ×3 IMPLANT
DRAPE U-SHAPE 47X51 STRL (DRAPES) IMPLANT
DRILL CANNULA 2.7MM (BIT) ×3 IMPLANT
DRSG PAD ABDOMINAL 8X10 ST (GAUZE/BANDAGES/DRESSINGS) ×15 IMPLANT
DURAPREP 26ML APPLICATOR (WOUND CARE) IMPLANT
ELECT REM PT RETURN 9FT ADLT (ELECTROSURGICAL) ×3
ELECTRODE REM PT RTRN 9FT ADLT (ELECTROSURGICAL) ×1 IMPLANT
FACESHIELD WRAPAROUND (MASK) IMPLANT
GAUZE SPONGE 4X4 12PLY STRL (GAUZE/BANDAGES/DRESSINGS) ×6 IMPLANT
GAUZE SPONGE 4X4 16PLY XRAY LF (GAUZE/BANDAGES/DRESSINGS) ×6 IMPLANT
GAUZE XEROFORM 5X9 LF (GAUZE/BANDAGES/DRESSINGS) ×3 IMPLANT
GLOVE BIOGEL PI IND STRL 7.5 (GLOVE) ×1 IMPLANT
GLOVE BIOGEL PI IND STRL 8 (GLOVE) ×1 IMPLANT
GLOVE BIOGEL PI INDICATOR 7.5 (GLOVE) ×2
GLOVE BIOGEL PI INDICATOR 8 (GLOVE) ×2
GLOVE SURG ORTHO 8.0 STRL STRW (GLOVE) ×3 IMPLANT
GLOVE SURG SS PI 7.5 STRL IVOR (GLOVE) ×3 IMPLANT
GOWN STRL REUS W/ TWL LRG LVL3 (GOWN DISPOSABLE) ×3 IMPLANT
GOWN STRL REUS W/TWL LRG LVL3 (GOWN DISPOSABLE) ×6
GUIDE PIN 1.3 (Pin) ×6 IMPLANT
HANDPIECE INTERPULSE COAX TIP (DISPOSABLE)
KIT BASIN OR (CUSTOM PROCEDURE TRAY) ×3 IMPLANT
KIT ROOM TURNOVER OR (KITS) ×3 IMPLANT
KIT STIMULAN RAPID CURE 5CC (Orthopedic Implant) ×3 IMPLANT
MANIFOLD NEPTUNE II (INSTRUMENTS) IMPLANT
NEEDLE HYPO 25GX1X1/2 BEV (NEEDLE) ×3 IMPLANT
NS IRRIG 1000ML POUR BTL (IV SOLUTION) ×18 IMPLANT
PACK ORTHO EXTREMITY (CUSTOM PROCEDURE TRAY) ×3 IMPLANT
PAD ABD 8X10 STRL (GAUZE/BANDAGES/DRESSINGS) ×3 IMPLANT
PAD ARMBOARD 7.5X6 YLW CONV (MISCELLANEOUS) ×6 IMPLANT
PAD CAST 4YDX4 CTTN HI CHSV (CAST SUPPLIES) IMPLANT
PADDING CAST ABS 4INX4YD NS (CAST SUPPLIES) ×2
PADDING CAST ABS COTTON 4X4 ST (CAST SUPPLIES) ×1 IMPLANT
PADDING CAST COTTON 4X4 STRL (CAST SUPPLIES)
PIN GUIDE 1.3 (Pin) ×2 IMPLANT
PLATE FIBULA DIST 4H (Plate) ×3 IMPLANT
SCREW 3.5X36MM (Screw) ×3 IMPLANT
SCREW 3.5X40 (Screw) ×3 IMPLANT
SCREW 5.0X10 (Screw) ×3 IMPLANT
SCREW CANN 4.0X50 (Screw) ×2 IMPLANT
SCREW CANN 50X4 (Screw) ×1 IMPLANT
SCREW CANNULATED 4.0X60 (Screw) ×3 IMPLANT
SCREW LOCK 10X3.5XST NS (Screw) ×4 IMPLANT
SCREW LOCK 12X3.5XST PRLC (Screw) ×1 IMPLANT
SCREW LOCK 3.5X10 (Screw) ×8 IMPLANT
SCREW LOCK 3.5X12 (Screw) ×2 IMPLANT
SCREW LOCK 3.5X8 (Screw) ×6 IMPLANT
SCREW LOCKING 3.5X6 (Screw) ×3 IMPLANT
SCREW NL 3.5X14 (Screw) ×3 IMPLANT
SCREW NL 3.5X38MM (Screw) ×3 IMPLANT
SCREW NON LOCK 3.5X12 (Screw) ×9 IMPLANT
SET HNDPC FAN SPRY TIP SCT (DISPOSABLE) IMPLANT
SPLINT PLASTER CAST XFAST 5X30 (CAST SUPPLIES) ×1 IMPLANT
SPLINT PLASTER XFAST SET 5X30 (CAST SUPPLIES) ×2
SPONGE GAUZE 4X4 12PLY STER LF (GAUZE/BANDAGES/DRESSINGS) ×3 IMPLANT
STOCKINETTE IMPERVIOUS 9X36 MD (GAUZE/BANDAGES/DRESSINGS) IMPLANT
SUCTION FRAZIER TIP 10 FR DISP (SUCTIONS) ×3 IMPLANT
SUT ETHILON 3 0 PS 1 (SUTURE) ×24 IMPLANT
SUT VIC AB 2-0 CTB1 (SUTURE) ×6 IMPLANT
SUT VIC AB 3-0 SH 27 (SUTURE) ×4
SUT VIC AB 3-0 SH 27X BRD (SUTURE) ×2 IMPLANT
SYR CONTROL 10ML LL (SYRINGE) ×3 IMPLANT
TOWEL OR 17X24 6PK STRL BLUE (TOWEL DISPOSABLE) ×3 IMPLANT
TOWEL OR 17X26 10 PK STRL BLUE (TOWEL DISPOSABLE) ×9 IMPLANT
TUBE CONNECTING 12'X1/4 (SUCTIONS) ×1
TUBE CONNECTING 12X1/4 (SUCTIONS) ×2 IMPLANT
WATER STERILE IRR 1000ML POUR (IV SOLUTION) IMPLANT
YANKAUER SUCT BULB TIP NO VENT (SUCTIONS) IMPLANT

## 2014-07-27 NOTE — Anesthesia Preprocedure Evaluation (Signed)
Anesthesia Evaluation  Patient identified by MRN, date of birth, ID band Patient awake    Reviewed: Allergy & Precautions, H&P , NPO status , Patient's Chart, lab work & pertinent test results  Airway Mallampati: II  Neck ROM: full    Dental   Pulmonary neg pulmonary ROS,          Cardiovascular negative cardio ROS      Neuro/Psych    GI/Hepatic   Endo/Other    Renal/GU      Musculoskeletal   Abdominal   Peds  Hematology   Anesthesia Other Findings   Reproductive/Obstetrics                           Anesthesia Physical Anesthesia Plan  ASA: I  Anesthesia Plan: General and Regional   Post-op Pain Management: MAC Combined w/ Regional for Post-op pain   Induction: Intravenous  Airway Management Planned: Oral ETT  Additional Equipment:   Intra-op Plan:   Post-operative Plan: Extubation in OR  Informed Consent: I have reviewed the patients History and Physical, chart, labs and discussed the procedure including the risks, benefits and alternatives for the proposed anesthesia with the patient or authorized representative who has indicated his/her understanding and acceptance.     Plan Discussed with: CRNA, Anesthesiologist and Surgeon  Anesthesia Plan Comments:         Anesthesia Quick Evaluation  

## 2014-07-27 NOTE — Progress Notes (Signed)
[facility] Community Liaison Stacy, ° °Will be sending information on [employer] orange Card program, using the address provided.  °

## 2014-07-27 NOTE — ED Notes (Signed)
Pt at hotel. Thought her child was drowning in the pool, jumped over a 6 foot fence and landed on her right ankle. Pt has open ankle fracture. EMS wrapped wash cloth around ankle and placed leg immobilizer to right leg. Pt received of fentanyl en route to ER and pain is 3/10 mat this time. Pt alert and oriented x 4, neuro intact. +2 right pedal pulse, warm to touch, and pt able to wiggle toes.

## 2014-07-27 NOTE — Anesthesia Procedure Notes (Addendum)
Anesthesia Regional Block:  Popliteal block  Pre-Anesthetic Checklist: ,, timeout performed, Correct Patient, Correct Site, Correct Laterality, Correct Procedure, Correct Position, site marked, Risks and benefits discussed,  Surgical consent,  Pre-op evaluation,  At surgeon's request and post-op pain management  Laterality: Right  Prep: chloraprep       Needles:  Injection technique: Single-shot  Needle Type: Echogenic Stimulator Needle     Needle Length: 9cm 9 cm Needle Gauge: 21 and 21 G    Additional Needles:  Procedures: ultrasound guided (picture in chart) and nerve stimulator Popliteal block  Nerve Stimulator or Paresthesia:  Response: plantar flexion of foot, 0.45 mA,   Additional Responses:   Narrative:  Start time: 07/27/2014 9:33 PM End time: 07/27/2014 9:47 PM Injection made incrementally with aspirations every 5 mL.  Performed by: Personally  Anesthesiologist: Dr Chaney MallingHodierne  Additional Notes: Functioning IV was confirmed and monitors were applied.  A 90mm 21ga Arrow echogenic stimulator needle was used. Sterile prep and drape,hand hygiene and sterile gloves were used.  Negative aspiration and negative test dose prior to incremental administration of local anesthetic. 25 mL of 0.5% Bupivacaine +epi was injected around the popliteal nerve.  The patient tolerated the procedure well.  Ultrasound guidance: relevent anatomy identified, needle position confirmed, local anesthetic spread visualized around nerve(s), vascular puncture avoided.  Image printed for medical record.   Adductor canal block also done.  20 mL of 0.5% Bupivacaine + epi given in this location.  Procedure done sterily.     Procedure Name: Intubation Date/Time: 07/27/2014 10:12 PM Performed by: Luster LandsbergHASE, Yelitza Reach R Pre-anesthesia Checklist: Patient identified, Emergency Drugs available, Suction available and Patient being monitored Patient Re-evaluated:Patient Re-evaluated prior to inductionOxygen Delivery  Method: Circle system utilized Preoxygenation: Pre-oxygenation with 100% oxygen Intubation Type: IV induction, Rapid sequence and Cricoid Pressure applied Laryngoscope Size: Mac and 3 Grade View: Grade I Tube type: Oral Tube size: 7.5 mm Number of attempts: 1 Airway Equipment and Method: Stylet Placement Confirmation: ETT inserted through vocal cords under direct vision,  positive ETCO2 and breath sounds checked- equal and bilateral Secured at: 22 cm Tube secured with: Tape

## 2014-07-27 NOTE — ED Notes (Signed)
Patient transported to CT 

## 2014-07-27 NOTE — Progress Notes (Signed)
Orthopedic Tech Progress Note Patient Details:  Jonny Ruiznkita Prisk 05-25-1981 [id number]  Ortho Devices Type of Ortho Device: Ace wrap;Post (short leg) splint Ortho Device/Splint Location: RLE Ortho Device/Splint Interventions: Ordered;Application   Jennye MoccasinHughes, Lessie Manigo Craig 07/27/2014, 7:29 PM

## 2014-07-27 NOTE — ED Provider Notes (Signed)
CSN: [id number]     Arrival date & time 07/27/14  1550 History   First MD Initiated Contact with Patient 07/27/14 1551     Chief Complaint  Patient presents with  . Ankle Injury     (Consider location/radiation/quality/duration/timing/severity/associated sxs/prior Treatment) HPI  Chelsea Lawrence is a 33 y.o. female with noncontributory past medical history, presenting for a right ankle fracture. Patient was at a hotel pool where her child getting swimming lessons. Patient over heard a sound that she thought was her child in distress, possibly drowning, and ran jumping over a fence and landing on her right ankle on a slippery deck. Patient had immediate pain and deformity of her right ankle with significant bleeding. Upon arrival and the EMS, they wrapped the wound and sterile dressings. Patient was given 150 mcg of fentanyl in route by EMS. On arrival patient endorses sensation in the affected extremity, states that pain is controlled this time. She denies any other injuries, chest pain, shortness of breath, abdominal pain, or other associated symptoms.     History reviewed. No pertinent past medical history. History reviewed. No pertinent past surgical history. No family history on file. History  Substance Use Topics  . Smoking status: Not on file  . Smokeless tobacco: Not on file  . Alcohol Use: Not on file   OB History   Grav Para Term Preterm Abortions TAB SAB Ect Mult Living                 Review of Systems  Constitutional: Negative.   HENT: Negative.   Eyes: Negative.   Respiratory: Negative.   Cardiovascular: Negative.   Gastrointestinal: Negative.   Endocrine: Negative.   Genitourinary: Negative.   Musculoskeletal: Positive for joint swelling.  Skin: Positive for wound.  Allergic/Immunologic: Negative.   Neurological: Negative.   Hematological: Negative.   Psychiatric/Behavioral: Negative.       Allergies  Review of patient's allergies indicates no known  allergies.  Home Medications   Prior to Admission medications   Not on File   BP 102/53  Pulse 109  Temp(Src) 98 F (36.7 C) (Oral)  Resp 27  Ht 5\' 4"  (1.626 m)  Wt 120 lb (54.432 kg)  BMI 20.59 kg/m2  SpO2 100%  LMP 07/11/2014 Physical Exam  Nursing note and vitals reviewed. Constitutional: She is oriented to person, place, and time. She appears well-developed and well-nourished. She appears distressed.  HENT:  Head: Normocephalic and atraumatic.  Right Ear: External ear normal.  Left Ear: External ear normal.  Nose: Nose normal.  Mouth/Throat: Oropharynx is clear and moist. No oropharyngeal exudate.  Eyes: Conjunctivae and EOM are normal. Pupils are equal, round, and reactive to light. Right eye exhibits no discharge. Left eye exhibits no discharge. No scleral icterus.  Neck: Normal range of motion. Neck supple. No JVD present. No tracheal deviation present. No thyromegaly present.  Cardiovascular: Regular rhythm, normal heart sounds and intact distal pulses.  Tachycardia present.  Exam reveals no gallop and no friction rub.   No murmur heard. Pulmonary/Chest: Breath sounds normal. No stridor. Tachypnea noted. No respiratory distress. She has no decreased breath sounds. She has no wheezes. She has no rhonchi. She has no rales. She exhibits no tenderness.  Abdominal: Soft. She exhibits no distension.  Musculoskeletal: She exhibits no edema and no tenderness.       Right ankle: She exhibits decreased range of motion, swelling, ecchymosis, deformity and laceration. She exhibits normal pulse.       Feet:  Varus deformity of R ankle with exposed bone from lateral aspect and appearance of exposed cartilage.  4cm open laceration.  NVI.  Neurological: She is alert and oriented to person, place, and time. No cranial nerve deficit or sensory deficit. GCS eye subscore is 4. GCS verbal subscore is 5. GCS motor subscore is 6.  Skin: Skin is warm and dry. No rash noted. She is not  diaphoretic. No erythema. No pallor.  Psychiatric: She has a normal mood and affect. Her behavior is normal. Judgment and thought content normal.    ED Course  Procedures (including critical care time) Labs Review Labs Reviewed  BASIC METABOLIC PANEL - Abnormal; Notable for the following:    Potassium 2.9 (*)    Glucose, Bld 137 (*)    Calcium 8.3 (*)    Anion gap 16 (*)    All other components within normal limits  CBC WITH DIFFERENTIAL - Abnormal; Notable for the following:    WBC 13.0 (*)    Hemoglobin 11.2 (*)    HCT 34.1 (*)    Lymphs Abs 5.4 (*)    All other components within normal limits  TYPE AND SCREEN  ABO/RH    Imaging Review Dg Ankle 2 Views Right  07/27/2014   CLINICAL DATA:  Ankle injury with open fracture dislocation.  EXAM: RIGHT ANKLE - 2 VIEW  COMPARISON:  None.  FINDINGS: Two views were obtained. There are comminuted fractures of the distal tibia and fibula. Both fractures demonstrate marked medial displacement by greater than 3 cm. There is some overriding of the fracture fragments. Evaluation for involvement of the tibial plafond is limited on these views. The talus, subtalar joint and calcaneus appear grossly intact. There is significant soft tissue injury with emphysema and soft tissue swelling.  IMPRESSION: Open, significantly displaced comminuted fractures of the distal tibia and fibula as described. Intra-articular extension of the tibial fracture is difficult to evaluate. CT may be helpful for further assessment.   Electronically Signed   By: Roxy Horseman M.D.   On: 07/27/2014 16:44   Dg Ankle Complete Right  07/28/2014   CLINICAL DATA:  Internal fixation of right ankle fracture.  EXAM: RIGHT ANKLE - COMPLETE 3+ VIEW; DG C-ARM 61-120 MIN  COMPARISON:  Right ankle radiograph and CT performed 07/27/2014  FINDINGS: Three fluoroscopic C-arm images are provided from the OR. These demonstrate successful placement of a plate and screws across the distal fibula, and  screws across the medial tibial fracture, transfixing known fractures in grossly anatomic alignment. No new fractures are identified. The subtalar joint is grossly unremarkable in appearance.  IMPRESSION: Successful internal fixation of distal fibular and medial tibial fractures in grossly anatomic alignment.   Electronically Signed   By: Roanna Raider M.D.   On: 07/28/2014 02:21   Ct Ankle Right Wo Contrast  07/27/2014   CLINICAL DATA:  Open fracture dislocation of the right ankle.  EXAM: CT OF THE RIGHT ANKLE WITHOUT CONTRAST  TECHNIQUE: Multidetector CT imaging was performed according to the standard protocol. Multiplanar CT image reconstructions were also generated.  COMPARISON:  Radiographs dated 07/27/2014  FINDINGS: The dislocation has been reduced. The distal 3 cm of the fibula is displaced posterior to the talus and tibia and rotated. The distal tibiofibular syndesmosis appears to be intact.  There is a comminuted pilon fracture of the tibial plafond and. There is a sagittal fracture through the lateral aspect of the tibial plafond with an area of slight impaction measuring approximately 17 x  16 mm. There are multiple tiny fragments of bone within the ankle joint. There is a 1 mm step-off between the main portion of the tibial plafond and the medial malleolus fragment.  There is a small avulsion of bone from the anterior lateral aspect of the distal tibia.  There are small avulsions of bone from the proximal dorsal aspect of the navicular as well as a tiny avulsion from the dorsal proximal aspect of the cuboid and from the distal lateral aspect of the calcaneus.  The tendons around the ankle are intact. The peroneal tendons are deviated laterally by the displaced lateral malleolar fragment and this fragment does impinge upon the distal flexor hallucis longus muscle and the an peroneus brevis muscle.  There is air in the soft tissues and in the joint due to the open fracture.  The talus is intact.   IMPRESSION: 1. Pilon fracture of the distal tibia with slight impaction and slight distraction of the sagittal fracture of the distal tibia. 2. Multiple small bone fragments in the ankle joint. 3. Marked displacement of the lateral malleolus posteriorly as described. 4. Distal tibiofibular syndesmosis appears to be intact.   Electronically Signed   By: Geanie CooleyJim  Maxwell M.D.   On: 07/27/2014 20:18   Dg Ankle Right Port  07/28/2014   CLINICAL DATA:  Status post internal fixation of right ankle fractures.  EXAM: PORTABLE RIGHT ANKLE - 2 VIEW  COMPARISON:  Intraoperative radiographs performed earlier today at 1:06 a.m.  FINDINGS: Evaluation of the soft tissues is suboptimal due to the overlying cast.  There has been successful internal fixation of the patient's distal tibial and fibular fractures, with plate and screws. The fractures are noted in grossly anatomic alignment. There is slight dorsal displacement of the talus, without significant subluxation. The subtalar joint is grossly unremarkable in appearance. No new fractures are seen.  IMPRESSION: Successful internal fixation of distal tibial and fibular fractures in grossly anatomic alignment. Slight dorsal displacement of the talus noted.   Electronically Signed   By: Roanna RaiderJeffery  Chang M.D.   On: 07/28/2014 02:28   Dg C-arm 61-120 Min  07/28/2014   CLINICAL DATA:  Internal fixation of right ankle fracture.  EXAM: RIGHT ANKLE - COMPLETE 3+ VIEW; DG C-ARM 61-120 MIN  COMPARISON:  Right ankle radiograph and CT performed 07/27/2014  FINDINGS: Three fluoroscopic C-arm images are provided from the OR. These demonstrate successful placement of a plate and screws across the distal fibula, and screws across the medial tibial fracture, transfixing known fractures in grossly anatomic alignment. No new fractures are identified. The subtalar joint is grossly unremarkable in appearance.  IMPRESSION: Successful internal fixation of distal fibular and medial tibial fractures in  grossly anatomic alignment.   Electronically Signed   By: Roanna RaiderJeffery  Chang M.D.   On: 07/28/2014 02:21     EKG Interpretation None      MDM   Final diagnoses:  Ankle injury, right, initial encounter    Visible deformity to right ankle with gross appearance of fracture dislocation of tibiotalar joint. Will obtain x-rays, will wash out at bedside, continue to give additional pain management, and consult orthopedics. Patient is mildly hypertensive following administration of narcotics by EMS, and in the emergency department will give additional fluid boluses to allow for adequate pain control.  Patient's pressure stable with additional IV fluids, and pain control improved with extremity support. Dr. August Saucerean with orthopedics consulted and will perform a reduction at bedside in preparation for operative management.  Radiology imaging  reviewed as above.  Successful closed reduction at bedside under conscious sedation.  Will obtain CT of the right ankle for operative planning. Basic labs ordered as well. Note hypokalemia, IV potassium repletion ordered.  Patient to operating room with orthopedics for operative management.  Patient care was discussed with my attending, Dr. Jodi Mourning.    Gavin Pound, MD 07/28/14 [phone]

## 2014-07-27 NOTE — ED Notes (Signed)
Contacted CT for immediate CT scan of right ankle per ortho physician request.  Pt to go to OR soon.  CT reported they have one scanner down, will get her ASAP.  CN aware.

## 2014-07-27 NOTE — H&P (Signed)
Chelsea Lawrence is an 33 y.o. female.   Chief Complaint: Right ankle pain HPI: An Chelsea Lawrence is a 33 year old female who was jumping over fence when she landed in slipped and sustained a significant right ankle injury. She was brought to the ER by EMS. Antibiotics were given in the ER. This injury happened approximate 4 hours ago. She denies any other orthopedic complaints  History reviewed. No pertinent past medical history.  History reviewed. No pertinent past surgical history.  No family history on file. Social History:  has no tobacco, alcohol, and drug history on file.  Allergies: No Known Allergies   (Not in a hospital admission)  Results for orders placed during the hospital encounter of 07/27/14 (from the past 48 hour(s))  TYPE AND SCREEN     Status: None   Collection Time    07/27/14  3:59 PM      Result Value Ref Range   ABO/RH(D) A POS     Antibody Screen NEG     Sample Expiration 07/30/2014    ABO/RH     Status: None   Collection Time    07/27/14  3:59 PM      Result Value Ref Range   ABO/RH(D) A POS    BASIC METABOLIC PANEL     Status: Abnormal   Collection Time    07/27/14  4:30 PM      Result Value Ref Range   Sodium 140  137 - 147 mEq/L   Potassium 2.9 (*) 3.7 - 5.3 mEq/L   Comment: CRITICAL RESULT CALLED TO, READ BACK BY AND VERIFIED WITH:     Leretha Dykes [id number] [address] M   Chloride 104  96 - 112 mEq/L   CO2 20  19 - 32 mEq/L   Glucose, Bld 137 (*) 70 - 99 mg/dL   BUN 13  6 - 23 mg/dL   Creatinine, Ser 0.63  0.50 - 1.10 mg/dL   Calcium 8.3 (*) 8.4 - 10.5 mg/dL   GFR calc non Af Amer >90  >90 mL/min   GFR calc Af Amer >90  >90 mL/min   Comment: (NOTE)     The eGFR has been calculated using the CKD EPI equation.     This calculation has not been validated in all clinical situations.     eGFR's persistently <90 mL/min signify possible Chronic Kidney     Disease.   Anion gap 16 (*) 5 - 15  CBC WITH DIFFERENTIAL     Status: Abnormal   Collection  Time    07/27/14  4:30 PM      Result Value Ref Range   WBC 13.0 (*) 4.0 - 10.5 K/uL   RBC 4.03  3.87 - 5.11 MIL/uL   Hemoglobin 11.2 (*) 12.0 - 15.0 g/dL   HCT 34.1 (*) 36.0 - 46.0 %   MCV 84.6  78.0 - 100.0 fL   MCH 27.8  26.0 - 34.0 pg   MCHC 32.8  30.0 - 36.0 g/dL   RDW 14.7  11.5 - 15.5 %   Platelets 335  150 - 400 K/uL   Neutrophils Relative % 52  43 - 77 %   Neutro Abs 6.8  1.7 - 7.7 K/uL   Lymphocytes Relative 41  12 - 46 %   Lymphs Abs 5.4 (*) 0.7 - 4.0 K/uL   Monocytes Relative 6  3 - 12 %   Monocytes Absolute 0.8  0.1 - 1.0 K/uL   Eosinophils Relative 1  0 -  5 %   Eosinophils Absolute 0.1  0.0 - 0.7 K/uL   Basophils Relative 0  0 - 1 %   Basophils Absolute 0.0  0.0 - 0.1 K/uL   Dg Ankle 2 Views Right  07/27/2014   CLINICAL DATA:  Ankle injury with open fracture dislocation.  EXAM: RIGHT ANKLE - 2 VIEW  COMPARISON:  None.  FINDINGS: Two views were obtained. There are comminuted fractures of the distal tibia and fibula. Both fractures demonstrate marked medial displacement by greater than 3 cm. There is some overriding of the fracture fragments. Evaluation for involvement of the tibial plafond is limited on these views. The talus, subtalar joint and calcaneus appear grossly intact. There is significant soft tissue injury with emphysema and soft tissue swelling.  IMPRESSION: Open, significantly displaced comminuted fractures of the distal tibia and fibula as described. Intra-articular extension of the tibial fracture is difficult to evaluate. CT may be helpful for further assessment.   Electronically Signed   By: Camie Patience M.D.   On: 07/27/2014 16:44    Review of Systems  Constitutional: Negative.   HENT: Negative.   Eyes: Negative.   Respiratory: Negative.   Cardiovascular: Negative.   Gastrointestinal: Negative.   Genitourinary: Negative.   Musculoskeletal: Positive for joint pain.  Skin: Negative.   Neurological: Negative.   Endo/Heme/Allergies: Negative.    Psychiatric/Behavioral: Negative.     Blood pressure 102/61, pulse 104, temperature 98 F (36.7 C), temperature source Oral, resp. rate 26, height '5\' 4"'  (1.626 m), weight 54.432 kg (120 lb), last menstrual period 07/11/2014, SpO2 100.00%. Physical Exam  Constitutional: She appears well-developed.  HENT:  Head: Normocephalic.  Eyes: Pupils are equal, round, and reactive to light.  Neck: Normal range of motion.  Cardiovascular: Normal rate.   Respiratory: Effort normal.  Neurological: She is alert.  Skin: Skin is warm.  Psychiatric: She has a normal mood and affect.   right ankle demonstrates open laceration laterally measuring about 5 cm. Through this the distal tibia has protruded. Is not attached to the fibula. Laceration is oblique. Foot is perfused and sensate dorsal and plantar. Approximately 3 cm of the distal tibia protrudes from the laceration. She has denies any other orthopedic complaints in the upper or left lower extremity.  Assessment/Plan Impression is open fracture dislocation right ankle. In the emergency room after informed consent was obtained conscious sedation was utilized to obtain reduction. There was some pressure on the inferior flap of skin. This was reduced with the reduction.. Patient with normal sterile saline was performed both prior to and during the reduction maneuver. This is done under conscious sedation. Plan at this time is for CT scanning followed by washout tonight with antibody bead placement possible open reduction internal fixation versus plate fixation. Risk and benefits discussed with patient and family we will and to infection ankle stiffness nerve vessel damage wishes to persistent infection all questions answered plan for surgery tonight  DEAN,GREGORY SCOTT 07/27/2014, 7:09 PM

## 2014-07-28 ENCOUNTER — Emergency Department (HOSPITAL_COMMUNITY): Payer: No Typology Code available for payment source

## 2014-07-28 ENCOUNTER — Inpatient Hospital Stay (HOSPITAL_COMMUNITY): Payer: No Typology Code available for payment source

## 2014-07-28 ENCOUNTER — Encounter (HOSPITAL_COMMUNITY): Payer: Self-pay | Admitting: *Deleted

## 2014-07-28 DIAGNOSIS — S82873B Displaced pilon fracture of unspecified tibia, initial encounter for open fracture type I or II: Secondary | ICD-10-CM | POA: Diagnosis present

## 2014-07-28 LAB — PROTIME-INR
INR: 1.26 (ref 0.00–1.49)
Prothrombin Time: 15.8 seconds — ABNORMAL HIGH (ref 11.6–15.2)

## 2014-07-28 LAB — TYPE AND SCREEN
ABO/RH(D): A POS
Antibody Screen: NEGATIVE

## 2014-07-28 MED ORDER — METOCLOPRAMIDE HCL 10 MG PO TABS: 5.0000 mg | ORAL_TABLET | Freq: Three times a day (TID) | ORAL | Status: DC | PRN

## 2014-07-28 MED ORDER — GLYCOPYRROLATE 0.2 MG/ML IJ SOLN
INTRAMUSCULAR | Status: DC | PRN
  Administered 2014-07-28: 0.2 mg via INTRAVENOUS

## 2014-07-28 MED ORDER — OXYCODONE HCL 5 MG PO TABS
ORAL_TABLET | ORAL | Status: AC
  Filled 2014-07-28: qty 1

## 2014-07-28 MED ORDER — ONDANSETRON HCL 4 MG PO TABS: 4.0000 mg | ORAL_TABLET | Freq: Four times a day (QID) | ORAL | Status: DC | PRN

## 2014-07-28 MED ORDER — ONDANSETRON HCL 4 MG/2ML IJ SOLN: 4.0000 mg | Freq: Four times a day (QID) | INTRAMUSCULAR | Status: DC | PRN

## 2014-07-28 MED ORDER — DEXTROSE 5 % IV SOLN: 500.0000 mg | Freq: Four times a day (QID) | INTRAVENOUS | Status: DC | PRN

## 2014-07-28 MED ORDER — OXYCODONE HCL 5 MG/5ML PO SOLN: 5.0000 mg | Freq: Once | ORAL | Status: AC | PRN

## 2014-07-28 MED ORDER — NEOSTIGMINE METHYLSULFATE 10 MG/10ML IV SOLN
INTRAVENOUS | Status: DC | PRN
  Administered 2014-07-28: 2 mg via INTRAVENOUS

## 2014-07-28 MED ORDER — WARFARIN VIDEO: Freq: Once | Status: DC

## 2014-07-28 MED ORDER — POTASSIUM CHLORIDE IN NACL 40-0.9 MEQ/L-% IV SOLN
INTRAVENOUS | Status: AC
  Administered 2014-07-28: 75 mL/h via INTRAVENOUS
  Filled 2014-07-28 (×3): qty 1000

## 2014-07-28 MED ORDER — WARFARIN SODIUM 7.5 MG PO TABS
7.5000 mg | ORAL_TABLET | Freq: Once | ORAL | Status: AC
  Administered 2014-07-28: 7.5 mg via ORAL
  Filled 2014-07-28: qty 1

## 2014-07-28 MED ORDER — METOCLOPRAMIDE HCL 5 MG/ML IJ SOLN: 5.0000 mg | Freq: Three times a day (TID) | INTRAMUSCULAR | Status: DC | PRN

## 2014-07-28 MED ORDER — MEPERIDINE HCL 25 MG/ML IJ SOLN
6.2500 mg | INTRAMUSCULAR | Status: DC | PRN
  Administered 2014-07-28: 12.5 mg via INTRAVENOUS

## 2014-07-28 MED ORDER — HYDROMORPHONE HCL PF 1 MG/ML IJ SOLN: 0.2500 mg | INTRAMUSCULAR | Status: DC | PRN

## 2014-07-28 MED ORDER — CEFAZOLIN SODIUM 1-5 GM-% IV SOLN
1.0000 g | Freq: Three times a day (TID) | INTRAVENOUS | Status: AC
Start: 2014-07-28 — End: 2014-07-29
  Administered 2014-07-28 – 2014-07-29 (×5): 1 g via INTRAVENOUS
  Filled 2014-07-28 (×5): qty 50

## 2014-07-28 MED ORDER — MORPHINE SULFATE 2 MG/ML IJ SOLN
1.0000 mg | INTRAMUSCULAR | Status: DC | PRN
  Administered 2014-07-28 – 2014-07-29 (×3): 1 mg via INTRAVENOUS
  Filled 2014-07-28 (×3): qty 1

## 2014-07-28 MED ORDER — ONDANSETRON HCL 4 MG/2ML IJ SOLN
INTRAMUSCULAR | Status: DC | PRN
  Administered 2014-07-28: 4 mg via INTRAVENOUS

## 2014-07-28 MED ORDER — DOCUSATE SODIUM 100 MG PO CAPS
100.0000 mg | ORAL_CAPSULE | Freq: Two times a day (BID) | ORAL | Status: DC
  Administered 2014-07-28 – 2014-07-29 (×3): 100 mg via ORAL
  Filled 2014-07-28 (×3): qty 1

## 2014-07-28 MED ORDER — MEPERIDINE HCL 25 MG/ML IJ SOLN
INTRAMUSCULAR | Status: AC
  Filled 2014-07-28: qty 1

## 2014-07-28 MED ORDER — WARFARIN - PHARMACIST DOSING INPATIENT: Freq: Every day | Status: DC

## 2014-07-28 MED ORDER — COUMADIN BOOK
Freq: Once | Status: AC
  Administered 2014-07-28: 17:00:00
  Filled 2014-07-28: qty 1

## 2014-07-28 MED ORDER — OXYCODONE HCL 5 MG PO TABS
5.0000 mg | ORAL_TABLET | Freq: Once | ORAL | Status: AC | PRN
  Administered 2014-07-28: 5 mg via ORAL

## 2014-07-28 MED ORDER — METHOCARBAMOL 500 MG PO TABS
500.0000 mg | ORAL_TABLET | Freq: Four times a day (QID) | ORAL | Status: DC | PRN
  Administered 2014-07-28: 500 mg via ORAL
  Filled 2014-07-28: qty 1

## 2014-07-28 MED ORDER — METHOCARBAMOL 500 MG PO TABS
ORAL_TABLET | ORAL | Status: AC
  Filled 2014-07-28: qty 1

## 2014-07-28 MED ORDER — OXYCODONE HCL 5 MG PO TABS
5.0000 mg | ORAL_TABLET | ORAL | Status: DC | PRN
Start: 2014-07-28 — End: 2014-07-29
  Administered 2014-07-28 – 2014-07-29 (×5): 10 mg via ORAL
  Filled 2014-07-28 (×5): qty 2

## 2014-07-28 NOTE — Transfer of Care (Signed)
Immediate Anesthesia Transfer of Care Note  Patient: Chelsea Lawrence  Procedure(s) Performed: Procedure(s): OPEN REDUCTION INTERNAL FIXATION (ORIF) ANKLE FRACTURE (Right)  Patient Location: PACU  Anesthesia Type:General  Level of Consciousness: responds to stimulation  Airway & Oxygen Therapy: Patient Spontanous Breathing and Patient connected to nasal cannula oxygen  Post-op Assessment: Report given to PACU RN, Post -op Vital signs reviewed and stable and Patient moving all extremities  Post vital signs: Reviewed and stable  Complications: No apparent anesthesia complications

## 2014-07-28 NOTE — Progress Notes (Signed)
Occupational Therapy Evaluation Patient Details Name: Chelsea Lawrence MRN: [id number] DOB: 26-Aug-1981 Today's Date: 07/28/2014    History of Present Illness Chelsea Lawrence is a 33 y.o. Female s/p ORIF for Right ankle open lateral malleolus fracture after jumping over a fence and sustaining an injury.   Clinical Impression   PTA pt lived at home with her husband and was independent with ADLs and functional mobility. Pt declined OOB activities due to fatigue and nausea following PT session and performed grooming activities sitting in chair. Educated pt and family on tub/shower transfers. Pt may have access to handicapped room at hotel (where she lives) but is interested in tub transfer. Pt would benefit from skilled OT to address LB ADLs, tub transfer, and toilet transfer.      Follow Up Recommendations  No OT follow up;Supervision/Assistance - 24 hour    Equipment Recommendations  Other (comment) (TBD following tub transfer demo)       Precautions / Restrictions Precautions Precautions: None Restrictions Weight Bearing Restrictions: Yes RLE Weight Bearing: Non weight bearing      Mobility Bed Mobility Overal bed mobility: Needs Assistance Bed Mobility: Supine to Sit     Supine to sit: Min guard;HOB elevated     General bed mobility comments: Pt sitting up in recliner when OT arrived.   Transfers Overall transfer level: Needs assistance Equipment used:  (knee walker ) Transfers: Sit to/from BJ'sStand;Stand Pivot Transfers Sit to Stand: Min guard Stand pivot transfers: Min guard;Min assist       General transfer comment: Pt declined OOB activities. Will follow up to address this as available this afternoon.          ADL Overall ADL's : Needs assistance/impaired Eating/Feeding: Independent;Sitting   Grooming: Set up;Sitting;Oral care   Upper Body Bathing: Set up;Sitting   Lower Body Bathing: Minimal assistance;Sitting/lateral leans   Upper Body Dressing : Set  up;Sitting   Lower Body Dressing: Moderate assistance;Sitting/lateral leans                 General ADL Comments: Pt declined OOB activities this morning due to fatigue and nausea following PT session. Educated pt and family on compensatory techniques for LB ADLs. Discussed possibility of pt using handicapped shower at hotel, but family is also interested in tub transfer technique and will demo this at a later time. Educated pt on chair push ups to increase UE strength. Educated pt and family on fall prevention and energy conservation.      Vision  Pt reports no change from baseline. No apparent visual deficits.                    Perception Perception Perception Tested?: No   Praxis Praxis Praxis tested?: Within functional limits    Pertinent Vitals/Pain Pain Assessment: No/denies pain ("I'm fine")     Hand Dominance Right   Extremity/Trunk Assessment Upper Extremity Assessment Upper Extremity Assessment: Overall WFL for tasks assessed   Lower Extremity Assessment Lower Extremity Assessment: Defer to PT evaluation RLE Deficits / Details: Rt LE numb and grossly 2/5 in knee and hip RLE: Unable to fully assess due to immobilization RLE Sensation: decreased light touch (c/o numbness ) RLE Coordination: decreased fine motor;decreased gross motor   Cervical / Trunk Assessment Cervical / Trunk Assessment: Normal   Communication Communication Communication: Prefers language other than English;Other (comment) (pt does speak some English and sister can translate if neede)   Cognition Arousal/Alertness: Awake/alert Behavior During Therapy: WFL for tasks assessed/performed Overall  Cognitive Status: Within Functional Limits for tasks assessed                                Home Living Family/patient expects to be discharged to:: Private residence Living Arrangements: Spouse/significant other;Other relatives (sister) Available Help at Discharge:  Family;Available 24 hours/day Type of Home: Other(Comment) [facility]) Home Access: Elevator     Home Layout: One level     Bathroom Shower/Tub: Tub/shower unit (may have access to walk-in in handicapped room downstairs) Shower/tub characteristics: Curtain Firefighter: Standard     Home Equipment: Wheelchair - manual   Additional Comments: 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; reports they may be able to have access to handicap room for showering      Prior Functioning/Environment Level of Independence: Independent             OT Diagnosis: Generalized weakness;Acute pain   OT Problem List: Decreased strength;Decreased range of motion;Decreased activity tolerance;Impaired balance (sitting and/or standing);Decreased safety awareness;Decreased knowledge of use of DME or AE;Pain   OT Treatment/Interventions: Self-care/ADL training;Therapeutic exercise;Energy conservation;DME and/or AE instruction;Therapeutic activities;Patient/family education;Balance training    OT Goals(Current goals can be found in the care plan section) Acute Rehab OT Goals Patient Stated Goal: to not be sick  OT Goal Formulation: With patient/family Time For Goal Achievement: 08/04/14 Potential to Achieve Goals: Good ADL Goals Pt Will Perform Grooming: with modified independence;standing (with knee walker) Pt Will Perform Lower Body Bathing: with modified independence;with adaptive equipment;sit to/from stand Pt Will Perform Lower Body Dressing: with modified independence;with adaptive equipment;sit to/from stand Pt Will Transfer to Toilet: with modified independence;ambulating;bedside commode (with knee walker) Pt Will Perform Toileting - Clothing Manipulation and hygiene: with modified independence;sit to/from stand Pt Will Perform Tub/Shower Transfer: Tub transfer;Shower transfer;with modified independence;ambulating;tub bench;3 in 1 (with knee walker)  OT Frequency: Min 2X/week               End of Session  Activity Tolerance: Patient limited by fatigue;Other (comment) (limited by nausea) Patient left: in chair;with call bell/phone within reach;with family/visitor present   Time: 6962-9528 OT Time Calculation (min): 11 min Charges:  OT General Charges $OT Visit: 1 Procedure OT Evaluation $Initial OT Evaluation Tier I: 1 Procedure G-Codes: OT G-codes **NOT FOR INPATIENT CLASS** Functional Assessment Tool Used: clinical judgement Functional Limitation: Self care Self Care Current Status (U1324): At least 20 percent but less than 40 percent impaired, limited or restricted Self Care Goal Status (M0102): At least 1 percent but less than 20 percent impaired, limited or restricted   Rae Lips [phone] 07/28/2014, 11:43 AM

## 2014-07-28 NOTE — Progress Notes (Signed)
ANTICOAGULATION CONSULT NOTE - Initial Consult  Pharmacy Consult for Coumadin Indication: VTE prophylaxis  No Known Allergies  Patient Measurements: Height: 5\' 4"  (162.6 cm) Weight: 120 lb (54.432 kg) IBW/kg (Calculated) : 54.7  Vital Signs: Temp: 98 F (36.7 C) (08/13 0540) BP: 88/54 mmHg (08/13 0540) Pulse Rate: 109 (08/13 0540)  Labs:  Recent Labs  07/27/14 1630 07/28/14 0559  HGB 11.2*  --   HCT 34.1*  --   PLT 335  --   LABPROT  --  15.8*  INR  --  1.26  CREATININE 0.63  --     Estimated Creatinine Clearance: 87.5 ml/min (by C-G formula based on Cr of 0.63).   Medical History: History reviewed. No pertinent past medical history.  Medications:  No prescriptions prior to admission    Assessment: 33 y.o. female presents with R open ankle fracture - s/p ORIF 8/13. To begin coumadin tonight for VTE prophylaxis. INR 1.26 at baseline. CBC ok at baseline.   Goal of Therapy:  INR 2-3 Monitor platelets by anticoagulation protocol: Yes   Plan:  1. Daily INR 2. Coumadin 7.5mg  po today 3. Coumadin educational book and video  Christoper Fabianaron Brailynn Breth, PharmD, BCPS Clinical pharmacist, pager [phone] 07/28/2014,12:54 PM

## 2014-07-28 NOTE — Brief Op Note (Signed)
07/27/2014 - 07/28/2014  1:46 AM  PATIENT:  Chelsea RuizAnkita Lawrence  33 y.o. female  PRE-OPERATIVE DIAGNOSIS:  Open Ankle Fracture - Right  POST-OPERATIVE DIAGNOSIS:  Open Ankle Fracture - Right  PROCEDURE:  Procedure(s): OPEN REDUCTION INTERNAL FIXATION (ORIF) ANKLE FRACTURE pilon and lateral malleolus  SURGEON:  Surgeon(s): Cammy CopaGregory Scott Iesha Summerhill, MD  ASSISTANT: none  ANESTHESIA:   general  EBL: 50 ml    Total I/O In: 2700 [I.V.:2700] Out: 2700 [Urine:2700]  BLOOD ADMINISTERED: none  DRAINS: none   LOCAL MEDICATIONS USED:  none  SPECIMEN:  No Specimen  COUNTS:  YES  TOURNIQUET:    DICTATION: .Other Dictation: Dictation Number [id number]  PLAN OF CARE: Admit to inpatient   PATIENT DISPOSITION:  PACU - hemodynamically stable

## 2014-07-28 NOTE — Progress Notes (Signed)
Subjective: Pt stable - no return yet fo m/sensory function right foot   Objective: Vital signs in last 24 hours: Temp:  [98 F (36.7 C)-98.5 F (36.9 C)] 98 F (36.7 C) (08/13 0540) Pulse Rate:  [88-128] 109 (08/13 0540) Resp:  [16-36] 18 (08/13 0540) BP: (88-130)/(52-77) 88/54 mmHg (08/13 0540) SpO2:  [96 %-100 %] 98 % (08/13 0540) Weight:  [54.432 kg (120 lb)] 54.432 kg (120 lb) (08/13 0333)  Intake/Output from previous day: 08/12 0701 - 08/13 0700 In: 2700 [I.V.:2700] Out: 4400 [Urine:4400] Intake/Output this shift: Total I/O In: -  Out: 875 [Urine:875]  Exam:  foot perfused but not sensate or mobile  Labs:  Recent Labs  07/27/14 1630  HGB 11.2*    Recent Labs  07/27/14 1630  WBC 13.0*  RBC 4.03  HCT 34.1*  PLT 335    Recent Labs  07/27/14 1630  NA 140  K 2.9*  CL 104  CO2 20  BUN 13  CREATININE 0.63  GLUCOSE 137*  CALCIUM 8.3*    Recent Labs  07/28/14 0559  INR 1.26    Assessment/Plan: Plan coumadin tonight nwb - pain meds when block wears off - dc am   Denvil Canning SCOTT 07/28/2014, 12:10 PM

## 2014-07-28 NOTE — Progress Notes (Signed)
Occupational Therapy Treatment Patient Details Name: Chelsea Lawrence Keirsey MRN: [id number] DOB: [birth year] Today's Date: 07/28/2014    History of present illness Chelsea Lawrence Musa is a 33 y.o. Female s/p ORIF for Right ankle open lateral malleolus fracture after jumping over a fence and sustaining an injury.   OT comments  Pt seen this afternoon for functional mobility and LB ADLs. Pt ambulated into bathroom and practiced toilet transfer at min (A) level for management of RLE. Pt returned to bed following treatment. Educated pt and family on compensatory techniques and environmental modifications. Pt may have access to handicap shower and encouraged family to utilize this due to space needed in bathroom to manage knee walker. Pt making good progress with mobility, however c/o tingling in RLE as nerve block wears off.    Follow Up Recommendations  No OT follow up;Supervision/Assistance - 24 hour    Equipment Recommendations  Other (comment) (TBD following tub transfer)       Precautions / Restrictions Precautions Precautions: None Restrictions Weight Bearing Restrictions: Yes RLE Weight Bearing: Non weight bearing       Mobility Bed Mobility Overal bed mobility: Needs Assistance Bed Mobility: Sit to Supine       Sit to supine: Min assist   General bed mobility comments: Min (A) to manage RLE onto bed. VC's for sequencing and use of UE to scoot hips back onto bed and use bed rails.  Transfers Overall transfer level: Needs assistance Equipment used:  (knee walker) Transfers: Sit to/from [employer]Stand;Stand Pivot Transfers Sit to Stand: Min assist Stand pivot transfers: Min assist       General transfer comment: Min (A) to manage Rt LE and maintain NWB, however pt with good mobility and able to pivot easily.     Balance Overall balance assessment: Needs assistance Sitting-balance support: No upper extremity supported;Feet supported Sitting balance-Leahy Scale: Fair     Standing balance  support: Bilateral upper extremity supported;During functional activity Standing balance-Leahy Scale: Poor                     ADL Overall ADL's : Needs assistance/impaired     Grooming: Wash/dry hands;Min guard;Standing       Lower Body Bathing: Minimal assistance;Sit to/from stand       Lower Body Dressing: Minimal assistance;Sit to/from stand   Toilet Transfer: Minimal assistance;Ambulation;BSC;Cueing for sequencing (knee walker)           Functional mobility during ADLs: Min guard (knee walker)                  Cognition  Arousal/Alertness: Awake/Alert Behavior During Therapy: WFL for tasks assessed/performed Overall Cognitive Status: Within Functional Limits for tasks assessed                                    Pertinent Vitals/ Pain       Pain Assessment: 0-10 Pain Score: 6  Pain Location: R ankle "6 for pain, 8 for tingling" Pain Descriptors / Indicators: Tingling Pain Intervention(s): Limited activity within patient's tolerance;Monitored during session;RN gave pain meds during session;Relaxation         Frequency Min 2X/week     Progress Toward Goals  OT Goals(current goals can now be found in the care plan section)  Progress towards OT goals: Progressing toward goals     Plan Discharge plan remains appropriate       End of Session Equipment  Utilized During Treatment: Other (comment);Gait belt (knee walker)   Activity Tolerance Patient tolerated treatment well   Patient Left in bed;with call bell/phone within reach;with family/visitor present   Nurse Communication Patient requests pain meds    Functional Assessment Tool Used: clinical judgement Functional Limitation: Self care Self Care Current Status (Z6109): At least 20 percent but less than 40 percent impaired, limited or restricted Self Care Goal Status (U0454): At least 1 percent but less than 20 percent impaired, limited or restricted   Time: 1714-1738 OT  Time Calculation (min): 24 min  Charges: OT G-codes **NOT FOR INPATIENT CLASS** Functional Assessment Tool Used: clinical judgement Functional Limitation: Self care Self Care Current Status (U9811): At least 20 percent but less than 40 percent impaired, limited or restricted Self Care Goal Status (B1478): At least 1 percent but less than 20 percent impaired, limited or restricted OT General Charges $OT Visit: 1 Procedure OT Treatments $Self Care/Home Management : 23-37 mins  Rae Lips [phone] 07/28/2014, 5:50 PM

## 2014-07-28 NOTE — Evaluation (Addendum)
Physical Therapy Evaluation Patient Details Name: Chelsea Lawrence MRN: [id number] DOB: 20-Jun-1981 Today's Date: 07/28/2014   History of Present Illness  Chelsea Lawrence is a 33 y.o. Female s/p ORIF for Right ankle open lateral malleolus fracture after jumping over a fence and sustaining an injury.  Clinical Impression  Patient is s/p surgery listed above due to fall resulting in functional limitations due to the deficits listed below (see PT Problem List). Patient will benefit from skilled PT to increase their independence and safety with mobility to allow discharge to the venue listed below. Pt limited in mobility due to nausea. Anticipate good progress with therapy. Pt very supported by family. Pt may want to attempt ambulation with RW prior to D/C.      Follow Up Recommendations No PT follow up;Supervision/Assistance - 24 hour;Other (comment) (OPPT when WB status changes)    Equipment Recommendations  Other (comment) (knee walker; possible RW )    Recommendations for Other Services OT consult     Precautions / Restrictions Precautions Precautions: None Restrictions Weight Bearing Restrictions: Yes RLE Weight Bearing: Non weight bearing      Mobility  Bed Mobility Overal bed mobility: Needs Assistance Bed Mobility: Supine to Sit     Supine to sit: Min guard;HOB elevated     General bed mobility comments: min guard to guide Rt LE off EOB and control descent to ground; use of handrails for bed mobility  Transfers Overall transfer level: Needs assistance Equipment used:  (knee walker ) Transfers: Sit to/from [employer] Transfers Sit to Stand: Min guard Stand pivot transfers: Min guard;Min assist       General transfer comment: min (A) to manage Rt LE and maintain NWB status when pivoting off knee walker and to chair ; cues for sequencing   Ambulation/Gait Ambulation/Gait assistance: Min guard Ambulation Distance (Feet): 50 Feet Assistive device:  (knee walker  ) Gait Pattern/deviations: Step-to pattern Gait velocity: decr Gait velocity interpretation: Below normal speed for age/gender General Gait Details: pt able to ambulate with knee walker and min guard to balance; cues for sequencing and safety  Stairs            Wheelchair Mobility    Modified Rankin (Stroke Patients Only)       Balance Overall balance assessment: Needs assistance Sitting-balance support: Feet supported;No upper extremity supported Sitting balance-Leahy Scale: Fair Sitting balance - Comments: sat EOB ~ 5 min; denied any dizziness    Standing balance support: During functional activity;Bilateral upper extremity supported Standing balance-Leahy Scale: Poor Standing balance comment: relied on min guard (A) and knee walker to balance                              Pertinent Vitals/Pain Pain Assessment: No/denies pain ("I'm fine")    Home Living Family/patient expects to be discharged to:: Private residence   Available Help at Discharge: Family;Available 24 hours/day Type of Home: Other(Comment) [facility]) Home Access: Elevator     Home Layout: One level Home Equipment: Wheelchair - manual Additional Comments: 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; reports they may be able to get handicap room     Prior Function Level of Independence: Independent               Hand Dominance   Dominant Hand: Right    Extremity/Trunk Assessment   Upper Extremity Assessment: Overall WFL for tasks assessed  Lower Extremity Assessment: RLE deficits/detail RLE Deficits / Details: Rt LE numb and grossly 2/5 in knee and hip    Cervical / Trunk Assessment: Normal  Communication   Communication: Prefers language other than [place];Other (comment) (does speak little english and sister can translate if needed)  Cognition Arousal/Alertness: Awake/alert Behavior During Therapy: WFL for tasks assessed/performed Overall Cognitive Status:  Within Functional Limits for tasks assessed                      General Comments General comments (skin integrity, edema, etc.): spoke with pt and family extensively regarding D/C planning    Exercises        Assessment/Plan    PT Assessment Patient needs continued PT services  PT Diagnosis Difficulty walking;Generalized weakness;Acute pain   PT Problem List Decreased strength;Decreased activity tolerance;Decreased balance;Decreased mobility;Decreased knowledge of use of DME;Decreased safety awareness;Pain;Impaired sensation;Decreased range of motion  PT Treatment Interventions DME instruction;Gait training;Functional mobility training;Therapeutic activities;Therapeutic exercise;Balance training;Neuromuscular re-education;Patient/family education;Wheelchair mobility training   PT Goals (Current goals can be found in the Care Plan section) Acute Rehab PT Goals Patient Stated Goal: to not be sick  PT Goal Formulation: With patient/family Time For Goal Achievement: 08/01/14 Potential to Achieve Goals: Good    Frequency Min 4X/week   Barriers to discharge        Co-evaluation               End of Session Equipment Utilized During Treatment: Gait belt Activity Tolerance: Other (comment) (c/o nausea ) Patient left: in chair;with call bell/phone within reach;with family/visitor present Nurse Communication: Mobility status;Other (comment) (pt c/o nausea )    Functional Assessment Tool Used: clinical judgement Functional Limitation: Mobility: Walking and moving around Mobility: Walking and Moving Around Current Status [phone](G8978): At least 20 percent but less than 40 percent impaired, limited or restricted Mobility: Walking and Moving Around Goal Status [phone](G8979): At least 1 percent but less than 20 percent impaired, limited or restricted    Time: 0840-0906 PT Time Calculation (min): 26 min   Charges:   PT Evaluation $Initial PT Evaluation Tier I: 1 Procedure PT  Treatments $Gait Training: 8-22 mins   PT G Codes:   Functional Assessment Tool Used: clinical judgement Functional Limitation: Mobility: Walking and moving around    [place], WoodburnBrittany N, South CarolinaPT  [phone] 07/28/2014, 11:31 AM

## 2014-07-28 NOTE — Op Note (Signed)
NAMENATOSHIA, SOUTER NO.:  [id number]  MEDICAL RECORD NO.:  [id number]  LOCATION:  [facility]                         FACILITY:  [facility]  PHYSICIAN:  Burnard Bunting, M.D.    DATE OF BIRTH:  24-Oct-1981  DATE OF PROCEDURE: DATE OF DISCHARGE:                              OPERATIVE REPORT   PREOPERATIVE DIAGNOSES:  Right ankle open lateral malleolus fracture and pilon fracture.  POSTOPERATIVE DIAGNOSIS:  Right ankle open lateral malleolus fracture and pilon fracture.  PROCEDURE:  Right ankle extensive debridement of subcutaneous tissue, skin, muscle, fascia, bone along with open reduction and internal fixation, and reduction of lateral malleolar fracture with open reduction and internal fixation of pilon fracture with disimpaction of the articular surface and fixation.  Antibiotic beads placement also performed.  SURGEON:  Burnard Bunting, M.D.  ASSISTANT:  None.  ANESTHESIA:  General.  INDICATIONS:  Chelsea Lawrence is a patient with right ankle fracture presents for operative management after explanation of risks and benefits.  PROCEDURE IN DETAIL:  The patient was brought to the operating room where general endotracheal anesthesia was induced.  Preoperative antibiotics were administered.  Time-out was called.  Right leg was prepped with Hibiclens saline, draped in sterile manner.  The patient had an oblique incision over the distal aspect of the metaphyseal region of the fibula.  The open part of the incision was about 6 cm.  This ankle dislocated easily, medially.  This open laceration was extended proximally and distally parallel to the axis of the tibia.  Thorough irrigation of 2 L of irrigating solution was performed on the distal end of the tibia which was re-dislocated through the laceration. Disimpaction of the articular surfaces was then performed and autologous bone grafting from the shear fracture on the medial aspect of the tibia was then used to pack  the disimpacted articular surface piece.  This could be easily seen on the CT scan.  Once this bone grafting and disimpaction was performed, thorough excisional debridement was performed, devitalized skin, subcutaneous tissue, muscle, fascia, and bone were debrided with a curette.  At this time, following disimpaction of the articular surface of the joint itself was then thoroughly irrigated using about 2 more liters of irrigating solution.  The fracture was then reduced.  Bone hook was required to retrieve the distal fibula from the posterior aspect of the ankle joint.  Once this was done, the fibular fracture was reduced and a Smith and Nephew locking plate applied.  Fracture reduction was difficult due to the transverse nature of the fracture.  Locking plate was then chosen in order to maximize fixation distally with locking screws, proximally using cortical screws.  Provisional fixation was achieved and then attention was directed medially.  Under fluoroscopic guidance, the pilon fracture was reduced and then 2 screws were placed parallel to the articular surface.  These were bicortical screws, two 4.0 cannulated cancellous screws were then placed to the malleolus tip.  Again, under fluoroscopic guidance, this gave good reduction of the articular surface.  At this time, attention was redirected towards the lateral side.  The lateral malleolus with provisional fixation was not quite precisely reduced, it was somewhat  posterior.  This was noted on the lateral fluoroscopic views.  The position of the lateral malleolus was then improved by after removing the locking screws and shifting the lateral malleolus anteriorly about 5 mm.  Good reduction and cortical contact was achieved.  Repeat fixation with the locking screws with good fixation achieved was performed.  The ankle was then stressed and the syndesmosis was stable.  This is in concordance with the preop CT scan, which showed  reduction of the syndesmosis preoperatively.  At this time, again thorough irrigation performed on the lateral incision.  Antibiotic beads were placed.  That incision was then closed using interrupted inverted 3-0 Vicryl and 3-0 nylon suture.  Medial incision closed using 3-0 Vicryl and 3-0 nylon suture.  Bulky well-padded splint applied.  The patient tolerated the procedure well without immediate complications. Transferred to recovery room in stable condition.  PLAN:  Coumadin and SCDs for DVT prophylaxis.  Anticipate hospital stay tonight and tomorrow night.     Burnard BuntingG. Scott Elonzo Sopp, M.D.     GSD/MEDQ  D:  07/28/2014  T:  07/28/2014  Job:  [id number]

## 2014-07-29 LAB — PROTIME-INR
INR: 1.62 — ABNORMAL HIGH (ref 0.00–1.49)
Prothrombin Time: 19.2 seconds — ABNORMAL HIGH (ref 11.6–15.2)

## 2014-07-29 MED ORDER — ONDANSETRON HCL 4 MG PO TABS: 4.0000 mg | ORAL_TABLET | Freq: Four times a day (QID) | ORAL | Status: AC | PRN

## 2014-07-29 MED ORDER — METHOCARBAMOL 500 MG PO TABS: 500.0000 mg | ORAL_TABLET | Freq: Four times a day (QID) | ORAL | Status: AC | PRN

## 2014-07-29 MED ORDER — OXYCODONE HCL 5 MG PO TABS: 5.0000 mg | ORAL_TABLET | ORAL | Status: AC | PRN

## 2014-07-29 MED ORDER — DSS 100 MG PO CAPS: 100.0000 mg | ORAL_CAPSULE | Freq: Two times a day (BID) | ORAL | Status: AC

## 2014-07-29 MED ORDER — WARFARIN SODIUM 5 MG PO TABS: 5.0000 mg | ORAL_TABLET | Freq: Every day | ORAL | Status: AC

## 2014-07-29 NOTE — Progress Notes (Signed)
Utilization review completed. Taliesin Hartlage, RN, BSN. 

## 2014-07-29 NOTE — Progress Notes (Signed)
Subjective: Pt stable - pain controlled   Objective: Vital signs in last 24 hours: Temp:  [98.3 F (36.8 C)-100.1 F (37.8 C)] 99.6 F (37.6 C) (08/14 0542) Pulse Rate:  [96-116] 105 (08/14 0542) Resp:  [18] 18 (08/14 0542) BP: (93-110)/(60-62) 110/62 mmHg (08/14 0542) SpO2:  [98 %-100 %] 98 % (08/14 0542)  Intake/Output from previous day: 08/13 0701 - 08/14 0700 In: -  Out: 3945 [Urine:3945] Intake/Output this shift: Total I/O In: 100 [P.O.:100] Out: 1 [Urine:1]  Exam:  Sensation intact distally Intact pulses distally Dorsiflexion/Plantar flexion intact  Labs:  Recent Labs  07/27/14 1630  HGB 11.2*    Recent Labs  07/27/14 1630  WBC 13.0*  RBC 4.03  HCT 34.1*  PLT 335    Recent Labs  07/27/14 1630  NA 140  K 2.9*  CL 104  CO2 20  BUN 13  CREATININE 0.63  GLUCOSE 137*  CALCIUM 8.3*    Recent Labs  07/28/14 0559 07/29/14 0500  INR 1.26 1.62*    Assessment/Plan: Plan dc today will need hhc   Kareen Jefferys SCOTT 07/29/2014, 12:06 PM

## 2014-07-29 NOTE — Progress Notes (Signed)
Patient provided with discharge instructions and follow up information. She is going home with HHPT/RN through [facility]. Form to pick up DME knee walker from [facility] has been provided by case manager. She will be going home with husband and family after current IV antibiotic finishes.

## 2014-07-29 NOTE — Progress Notes (Signed)
Physical Therapy Treatment Patient Details Name: Chelsea Lawrence Hudlow MRN: [id number] DOB: 1981-11-09 Today's Date: 07/29/2014    History of Present Illness Chelsea Lawrence Ponciano is a 33 y.o. Female s/p ORIF for Right ankle open lateral malleolus fracture after jumping over a fence and sustaining an injury.    PT Comments    Pt able to progress mobility with knee walker. Also attempted ambulating with RW. Pt able to increase mobility distance with knee walker. Pt at supervision level for mobility and transfers at this time. Safe from mobility standpoint to D/C home with family support.   Follow Up Recommendations  No PT follow up;Supervision/Assistance - 24 hour;Other (comment)     Equipment Recommendations  Other (comment) (knee walker )    Recommendations for Other Services       Precautions / Restrictions Precautions Precautions: None Restrictions Weight Bearing Restrictions: Yes RLE Weight Bearing: Non weight bearing    Mobility  Bed Mobility Overal bed mobility: Needs Assistance Bed Mobility: Supine to Sit     Supine to sit: Min guard;HOB elevated     General bed mobility comments: min guard to control Rt LE to ground; pt was able to advance Rt LE to EOB but limited in knee flexion and controlling LE down to ground due to pain  Transfers Overall transfer level: Needs assistance Equipment used: Rolling walker (2 wheeled) (knee walker) Transfers: Sit to/from BJ'sStand;Stand Pivot Transfers Sit to Stand: Supervision Stand pivot transfers: Min guard       General transfer comment: cues for technique and safety with transfers; performed transfers from bed, chair and toilet; pt preferred using RW for transfer and knee walker for long distance mobility   Ambulation/Gait Ambulation/Gait assistance: Supervision Ambulation Distance (Feet): 120 Feet (100, 20 ) Assistive device: Rolling walker (2 wheeled) (knee walker) Gait Pattern/deviations: Step-to pattern Gait velocity: decr Gait  velocity interpretation: Below normal speed for age/gender General Gait Details: pt ambulated with knee walker for incr distance and RW for short distance; was at supervision for safety; demo good ability to balance and maintain NWB with both devices; pt preferred knee walker for incr distances; had difficulty incr gt distance with RW    Stairs            Wheelchair Mobility    Modified Rankin (Stroke Patients Only)       Balance Overall balance assessment: Needs assistance Sitting-balance support: Feet supported;No upper extremity supported Sitting balance-Leahy Scale: Fair     Standing balance support: During functional activity;Bilateral upper extremity supported Standing balance-Leahy Scale: Poor Standing balance comment: relied on UE support for balance; stood at sink for ADLs ~5 min with UE support from sink                    Cognition Arousal/Alertness: Awake/alert Behavior During Therapy: WFL for tasks assessed/performed Overall Cognitive Status: Within Functional Limits for tasks assessed                      Exercises      General Comments General comments (skin integrity, edema, etc.): answered questions regardin D/C planning; DME needs and recommendations       Pertinent Vitals/Pain Pain Assessment: 0-10 Pain Score: 4  Pain Location: Rt ankle Pain Descriptors / Indicators: Sharp;Throbbing Pain Intervention(s): Monitored during session;Premedicated before session;Repositioned;Ice applied    Home Living                      Prior Function  PT Goals (current goals can now be found in the care plan section) Acute Rehab PT Goals Patient Stated Goal: to go home today PT Goal Formulation: With patient/family Time For Goal Achievement: 08/01/14 Potential to Achieve Goals: Good Progress towards PT goals: Progressing toward goals    Frequency  Min 4X/week    PT Plan Current plan remains appropriate     Co-evaluation             End of Session Equipment Utilized During Treatment: Gait belt Activity Tolerance: Patient tolerated treatment well Patient left: in chair;with call bell/phone within reach     Time: 0837-0906 PT Time Calculation (min): 29 min  Charges:  $Gait Training: 8-22 mins $Therapeutic Activity: 8-22 mins                    G Codes:  Functional Assessment Tool Used: clinical judgement Functional Limitation: Mobility: Walking and moving around Mobility: Walking and Moving Around Current Status [phone]): At least 1 percent but less than 20 percent impaired, limited or restricted Mobility: Walking and Moving Around Goal Status [phone]): At least 1 percent but less than 20 percent impaired, limited or restricted Mobility: Walking and Moving Around Discharge Status [phone]): At least 1 percent but less than 20 percent impaired, limited or restricted   Donell Sievert, [place]  [phone] 07/29/2014, 10:41 AM

## 2014-07-29 NOTE — Discharge Instructions (Signed)

## 2014-07-29 NOTE — Care Management Note (Signed)
CARE MANAGEMENT NOTE 07/29/2014  Patient:  Chelsea Lawrence,Chelsea Lawrence   Account Number:  [id number]  Date Initiated:  07/29/2014  Documentation initiated by:  Vance PeperBRADY,Aldea Avis  Subjective/Objective Assessment:   33 yr old female s/p ORIF of right ankle.     Action/Plan:   Case manager spoke with patient and family concerning home health  and DME needs. Referral called to Jodene NamMary Hickling, [facility] liasion.   Anticipated DC Date:  07/29/2014   Anticipated DC Plan:  HOME W HOME HEALTH SERVICES      DC Planning Services  CM consult      [facility]  DURABLE MEDICAL EQUIPMENT   Choice offered to / List presented to:  C-1 Patient   DME arranged  [facility]      DME agency  [facility]     [facility]  [facility] PT  HH[facility]1 RN      [facility]   Status of service:  Completed, signed off Discharge Disposition:  HOME W HOME HEALTH SERVICES  Per UR Regulation:  Reviewed for med. necessity/level of care/duration of stay  If discussed at Long Length of Stay Meetings, dates discussed:    Comments:  07/29/14 12:30pm Vance PeperSusan Landon Bassford, RN BSN Case Manager Patient's husband wants calls to be made to [phone]. they stay @  [facility]  [address]

## 2014-08-01 ENCOUNTER — Encounter (HOSPITAL_COMMUNITY): Payer: Self-pay | Admitting: Obstetrics & Gynecology

## 2014-08-01 NOTE — ED Provider Notes (Signed)
Medical screening examination/treatment/procedure(s) were conducted as a shared visit with non-physician practitioner(s) or resident and myself. I personally evaluated the patient during the encounter and agree with the findings.  I have personally reviewed any xrays and/ or EKG's with the provider and I agree with interpretation.  Patient with mechanical fall after she jumped over a 6 foot fence to check on her child. Patient presented with EMS with open right ankle fracture. Patient has good distal pulses, significant open fracture with bone visualized, mild bleeding controlled. Wound here again, pain meds given and pain control, antibiotics given. Orthopedics came down for reduction, procedural sedation given by ER physicians ketamine was used. Plan for operating room later this evening. No other injuries.  Procedural sedation Performed by: Enid SkeensZAVITZ, Britzy Graul M  Consent: Verbal consent obtained. Risks and benefits: risks, benefits and alternatives were discussed Required items: required blood products, implants, devices, and special equipment available  Patient identity confirmed: arm band and provided demographic data  Time out: Immediately prior to procedure a "time out" was called to verify the correct patient, procedure, equipment, support staff and site  Sedation type: moderate (conscious) sedation NPO time confirmed, risks discussed  Sedatives: ketamine  Physician Time at Bedside: 15 min  Vitals: Vital signs were monitored during sedation. Cardiac Monitor, pulse oximeter Patient tolerance: Patient tolerated the procedure well with no immediate complications. Comments: Pt with uneventful recovered. Returned to pre-procedural sedation baseline  Right open ankle fracture, fall   Enid SkeensJoshua M Shannon Balthazar, MD 08/01/14 1004

## 2014-08-05 NOTE — Anesthesia Postprocedure Evaluation (Signed)
Anesthesia Post Note  Patient: Chelsea Lawrence  Procedure(s) Performed: Procedure(s) (LRB): OPEN REDUCTION INTERNAL FIXATION (ORIF) ANKLE FRACTURE (Right)  Anesthesia type: General  Patient location: PACU  Post pain: Pain level controlled and Adequate analgesia  Post assessment: Post-op Vital signs reviewed, Patient's Cardiovascular Status Stable, Respiratory Function Stable, Patent Airway and Pain level controlled  Last Vitals:  Filed Vitals:   07/29/14 1250  BP: 108/72  Pulse: 103  Temp: 36.8 C  Resp: 18    Post vital signs: Reviewed and stable  Level of consciousness: awake, alert  and oriented  Complications: No apparent anesthesia complications

## 2014-09-01 ENCOUNTER — Ambulatory Visit: Payer: No Typology Code available for payment source | Attending: Orthopedic Surgery | Admitting: Physical Therapy

## 2014-09-01 DIAGNOSIS — M25676 Stiffness of unspecified foot, not elsewhere classified: Secondary | ICD-10-CM | POA: Insufficient documentation

## 2014-09-01 DIAGNOSIS — R269 Unspecified abnormalities of gait and mobility: Secondary | ICD-10-CM | POA: Diagnosis not present

## 2014-09-01 DIAGNOSIS — M6281 Muscle weakness (generalized): Secondary | ICD-10-CM | POA: Diagnosis not present

## 2014-09-01 DIAGNOSIS — IMO0001 Reserved for inherently not codable concepts without codable children: Secondary | ICD-10-CM | POA: Diagnosis not present

## 2014-09-01 DIAGNOSIS — M25673 Stiffness of unspecified ankle, not elsewhere classified: Secondary | ICD-10-CM | POA: Diagnosis not present

## 2014-09-01 DIAGNOSIS — M25579 Pain in unspecified ankle and joints of unspecified foot: Secondary | ICD-10-CM | POA: Diagnosis not present

## 2014-09-02 ENCOUNTER — Ambulatory Visit: Payer: No Typology Code available for payment source | Admitting: Rehabilitation

## 2014-09-02 DIAGNOSIS — IMO0001 Reserved for inherently not codable concepts without codable children: Secondary | ICD-10-CM | POA: Diagnosis not present

## 2014-09-06 ENCOUNTER — Ambulatory Visit: Payer: No Typology Code available for payment source | Admitting: Rehabilitation

## 2014-09-06 DIAGNOSIS — IMO0001 Reserved for inherently not codable concepts without codable children: Secondary | ICD-10-CM | POA: Diagnosis not present

## 2014-09-07 ENCOUNTER — Ambulatory Visit: Payer: No Typology Code available for payment source | Admitting: Physical Therapy

## 2014-09-07 DIAGNOSIS — IMO0001 Reserved for inherently not codable concepts without codable children: Secondary | ICD-10-CM | POA: Diagnosis not present

## 2014-09-09 ENCOUNTER — Ambulatory Visit: Payer: No Typology Code available for payment source | Admitting: Rehabilitation

## 2014-09-09 DIAGNOSIS — IMO0001 Reserved for inherently not codable concepts without codable children: Secondary | ICD-10-CM | POA: Diagnosis not present

## 2014-09-13 ENCOUNTER — Ambulatory Visit: Payer: No Typology Code available for payment source | Admitting: Rehabilitation

## 2014-09-13 DIAGNOSIS — IMO0001 Reserved for inherently not codable concepts without codable children: Secondary | ICD-10-CM | POA: Diagnosis not present

## 2014-09-14 ENCOUNTER — Ambulatory Visit: Payer: No Typology Code available for payment source | Admitting: Rehabilitation

## 2014-09-14 DIAGNOSIS — IMO0001 Reserved for inherently not codable concepts without codable children: Secondary | ICD-10-CM | POA: Diagnosis not present

## 2014-09-15 ENCOUNTER — Ambulatory Visit: Payer: No Typology Code available for payment source | Attending: Orthopedic Surgery | Admitting: Physical Therapy

## 2014-09-15 DIAGNOSIS — M6281 Muscle weakness (generalized): Secondary | ICD-10-CM | POA: Insufficient documentation

## 2014-09-15 DIAGNOSIS — Z5189 Encounter for other specified aftercare: Secondary | ICD-10-CM | POA: Insufficient documentation

## 2014-09-15 DIAGNOSIS — M25571 Pain in right ankle and joints of right foot: Secondary | ICD-10-CM | POA: Insufficient documentation

## 2014-09-15 DIAGNOSIS — M25671 Stiffness of right ankle, not elsewhere classified: Secondary | ICD-10-CM | POA: Diagnosis not present

## 2014-09-15 DIAGNOSIS — R269 Unspecified abnormalities of gait and mobility: Secondary | ICD-10-CM | POA: Insufficient documentation

## 2014-09-19 ENCOUNTER — Ambulatory Visit: Payer: No Typology Code available for payment source | Admitting: Physical Therapy

## 2014-09-19 DIAGNOSIS — Z5189 Encounter for other specified aftercare: Secondary | ICD-10-CM | POA: Diagnosis not present

## 2014-09-21 ENCOUNTER — Ambulatory Visit: Payer: No Typology Code available for payment source | Admitting: Rehabilitation

## 2014-09-21 DIAGNOSIS — Z5189 Encounter for other specified aftercare: Secondary | ICD-10-CM | POA: Diagnosis not present

## 2014-09-23 ENCOUNTER — Ambulatory Visit: Payer: No Typology Code available for payment source | Admitting: Rehabilitation

## 2014-09-23 DIAGNOSIS — Z5189 Encounter for other specified aftercare: Secondary | ICD-10-CM | POA: Diagnosis not present

## 2014-09-26 ENCOUNTER — Ambulatory Visit: Payer: No Typology Code available for payment source | Admitting: Physical Therapy

## 2014-09-27 ENCOUNTER — Ambulatory Visit: Payer: No Typology Code available for payment source | Admitting: Rehabilitation

## 2014-09-27 DIAGNOSIS — Z5189 Encounter for other specified aftercare: Secondary | ICD-10-CM | POA: Diagnosis not present

## 2014-09-28 ENCOUNTER — Ambulatory Visit: Payer: No Typology Code available for payment source | Admitting: Rehabilitation

## 2014-09-28 DIAGNOSIS — Z5189 Encounter for other specified aftercare: Secondary | ICD-10-CM | POA: Diagnosis not present

## 2014-09-30 ENCOUNTER — Ambulatory Visit: Payer: No Typology Code available for payment source | Admitting: Rehabilitation

## 2014-09-30 DIAGNOSIS — Z5189 Encounter for other specified aftercare: Secondary | ICD-10-CM | POA: Diagnosis not present

## 2014-10-04 ENCOUNTER — Ambulatory Visit: Payer: No Typology Code available for payment source | Admitting: Rehabilitation

## 2014-10-04 DIAGNOSIS — Z5189 Encounter for other specified aftercare: Secondary | ICD-10-CM | POA: Diagnosis not present

## 2014-10-05 ENCOUNTER — Ambulatory Visit: Payer: No Typology Code available for payment source | Admitting: Rehabilitation

## 2014-10-05 DIAGNOSIS — Z5189 Encounter for other specified aftercare: Secondary | ICD-10-CM | POA: Diagnosis not present

## 2014-10-07 ENCOUNTER — Ambulatory Visit: Payer: No Typology Code available for payment source | Admitting: Physical Therapy

## 2014-10-07 DIAGNOSIS — Z5189 Encounter for other specified aftercare: Secondary | ICD-10-CM | POA: Diagnosis not present

## 2014-10-11 ENCOUNTER — Ambulatory Visit: Payer: No Typology Code available for payment source | Admitting: Physical Therapy

## 2014-10-11 DIAGNOSIS — Z5189 Encounter for other specified aftercare: Secondary | ICD-10-CM | POA: Diagnosis not present

## 2014-10-12 ENCOUNTER — Encounter: Payer: Self-pay | Admitting: Physical Therapy

## 2014-10-13 ENCOUNTER — Ambulatory Visit: Payer: No Typology Code available for payment source | Admitting: Rehabilitation

## 2014-10-13 ENCOUNTER — Encounter: Payer: Self-pay | Admitting: Rehabilitation

## 2014-10-13 DIAGNOSIS — Z5189 Encounter for other specified aftercare: Secondary | ICD-10-CM | POA: Diagnosis not present

## 2014-10-17 ENCOUNTER — Encounter (HOSPITAL_COMMUNITY): Payer: Self-pay | Admitting: Obstetrics & Gynecology

## 2014-10-18 ENCOUNTER — Ambulatory Visit: Payer: No Typology Code available for payment source | Attending: Orthopedic Surgery | Admitting: Physical Therapy

## 2014-10-18 DIAGNOSIS — M25571 Pain in right ankle and joints of right foot: Secondary | ICD-10-CM | POA: Diagnosis not present

## 2014-10-18 DIAGNOSIS — Z5189 Encounter for other specified aftercare: Secondary | ICD-10-CM | POA: Diagnosis present

## 2014-10-18 DIAGNOSIS — M6281 Muscle weakness (generalized): Secondary | ICD-10-CM | POA: Diagnosis not present

## 2014-10-18 DIAGNOSIS — R269 Unspecified abnormalities of gait and mobility: Secondary | ICD-10-CM | POA: Insufficient documentation

## 2014-10-18 DIAGNOSIS — M25671 Stiffness of right ankle, not elsewhere classified: Secondary | ICD-10-CM | POA: Insufficient documentation

## 2014-10-21 ENCOUNTER — Ambulatory Visit: Payer: No Typology Code available for payment source | Admitting: Rehabilitation

## 2014-10-21 DIAGNOSIS — Z5189 Encounter for other specified aftercare: Secondary | ICD-10-CM | POA: Diagnosis not present

## 2014-10-24 ENCOUNTER — Ambulatory Visit: Payer: No Typology Code available for payment source | Admitting: Rehabilitation

## 2014-10-26 ENCOUNTER — Ambulatory Visit: Payer: No Typology Code available for payment source | Admitting: Physical Therapy

## 2014-10-28 ENCOUNTER — Ambulatory Visit: Payer: No Typology Code available for payment source | Admitting: Physical Therapy

## 2014-10-28 DIAGNOSIS — Z5189 Encounter for other specified aftercare: Secondary | ICD-10-CM | POA: Diagnosis not present

## 2014-11-02 ENCOUNTER — Ambulatory Visit: Payer: No Typology Code available for payment source | Admitting: Physical Therapy

## 2014-11-04 ENCOUNTER — Ambulatory Visit: Payer: No Typology Code available for payment source | Admitting: Physical Therapy

## 2014-11-04 DIAGNOSIS — Z5189 Encounter for other specified aftercare: Secondary | ICD-10-CM | POA: Diagnosis not present

## 2014-11-07 ENCOUNTER — Ambulatory Visit: Payer: No Typology Code available for payment source | Admitting: Rehabilitation

## 2014-11-07 DIAGNOSIS — Z5189 Encounter for other specified aftercare: Secondary | ICD-10-CM | POA: Diagnosis not present

## 2014-11-08 ENCOUNTER — Ambulatory Visit: Payer: No Typology Code available for payment source | Admitting: Rehabilitation

## 2014-11-15 ENCOUNTER — Ambulatory Visit: Payer: No Typology Code available for payment source | Attending: Orthopedic Surgery | Admitting: Physical Therapy

## 2014-11-15 DIAGNOSIS — M6281 Muscle weakness (generalized): Secondary | ICD-10-CM | POA: Diagnosis not present

## 2014-11-15 DIAGNOSIS — M25571 Pain in right ankle and joints of right foot: Secondary | ICD-10-CM | POA: Diagnosis not present

## 2014-11-15 DIAGNOSIS — M25671 Stiffness of right ankle, not elsewhere classified: Secondary | ICD-10-CM | POA: Diagnosis not present

## 2014-11-15 DIAGNOSIS — Z5189 Encounter for other specified aftercare: Secondary | ICD-10-CM | POA: Insufficient documentation

## 2014-11-15 DIAGNOSIS — R269 Unspecified abnormalities of gait and mobility: Secondary | ICD-10-CM | POA: Insufficient documentation

## 2014-11-18 ENCOUNTER — Ambulatory Visit: Payer: No Typology Code available for payment source | Admitting: Physical Therapy

## 2014-11-18 DIAGNOSIS — Z5189 Encounter for other specified aftercare: Secondary | ICD-10-CM | POA: Diagnosis not present

## 2014-11-22 ENCOUNTER — Ambulatory Visit: Payer: No Typology Code available for payment source | Admitting: Physical Therapy

## 2014-11-22 DIAGNOSIS — Z5189 Encounter for other specified aftercare: Secondary | ICD-10-CM | POA: Diagnosis not present

## 2014-11-23 ENCOUNTER — Ambulatory Visit: Payer: No Typology Code available for payment source | Admitting: Physical Therapy

## 2014-11-23 DIAGNOSIS — Z5189 Encounter for other specified aftercare: Secondary | ICD-10-CM | POA: Diagnosis not present

## 2018-06-04 ENCOUNTER — Ambulatory Visit (INDEPENDENT_AMBULATORY_CARE_PROVIDER_SITE_OTHER): Payer: BLUE CROSS/BLUE SHIELD

## 2018-06-04 ENCOUNTER — Ambulatory Visit (INDEPENDENT_AMBULATORY_CARE_PROVIDER_SITE_OTHER): Payer: BLUE CROSS/BLUE SHIELD | Admitting: Orthopedic Surgery

## 2018-06-04 ENCOUNTER — Encounter (INDEPENDENT_AMBULATORY_CARE_PROVIDER_SITE_OTHER): Payer: Self-pay | Admitting: Orthopedic Surgery

## 2018-06-04 DIAGNOSIS — M25571 Pain in right ankle and joints of right foot: Secondary | ICD-10-CM

## 2018-06-04 NOTE — Progress Notes (Signed)
Office Visit Note   Patient: Chelsea Lawrence           Date of Birth: August 31, 1981           MRN: [id number] Visit Date: 06/04/2018 Requested by: Loyal Jacobson, MD [address] [place], [place] [address] PCP: Loyal Jacobson, MD  Subjective: Chief Complaint  Patient presents with  . Right Ankle - Pain    HPI: Patient presents for evaluation of right ankle.  She describes right ankle pain and swelling.  She had right ankle open reduction internal fixation 4 years ago for an open fracture.  She is not having any fevers or chills or drainage.  She is having no real lateral pain but all of her pain is medial.  She walks upwards of 4 miles a day 3 days a week.              ROS: All systems reviewed are negative as they relate to the chief complaint within the history of present illness.  Patient denies  fevers or chills. Impression is right ankle pain  Assessment & Plan: Visit Diagnoses:  1. Pain in right ankle and joints of right foot     Plan: Status post ORIF 4 years ago.  No significant arthritis on plain radiographs.  She may be getting some symptomatic hardware on that medial side.  She is very thin.  I am going to have her try some topical anti-inflammatory to that region as well as modify her activity.  I will see her back as needed.  Do not want her to do a lot of walking.  Follow-Up Instructions: Return if symptoms worsen or fail to improve.   Orders:  Orders Placed This Encounter  Procedures  . XR Ankle Complete Right   No orders of the defined types were placed in this encounter.     Procedures: No procedures performed   Clinical Data: No additional findings.  Objective: Vital Signs: There were no vitals taken for this visit.  Physical Exam:   Constitutional: Patient appears well-developed HEENT:  Head: Normocephalic Eyes:EOM are normal Neck: Normal range of motion Cardiovascular: Normal rate Pulmonary/chest: Effort normal Neurologic:  Patient is alert Skin: Skin is warm Psychiatric: Patient has normal mood and affect    Ortho Exam: Ortho exam demonstrates pretty normal gait alignment.  Well-healed surgical incisions on the medial and lateral aspect of the ankle.  No warmth or swelling or erythema around the ankle.  Range of motion is full.  Patient has palpable intact nontender anterior to posterior to peroneal and Achilles tendons.  Syndesmosis stable.  No real grinding or crepitus with passive range of motion of that ankle  Specialty Comments:  No specialty comments available.  Imaging: Xr Ankle Complete Right  Result Date: 06/04/2018 AP lateral mortise right ankle reviewed.  Ankle fracture fixation of bimalleolar ankle fracture in good position and alignment.  No evidence of hardware complication.  Mortise is symmetric.  No evidence of syndesmotic instability.    PMFS History: Patient Active Problem List   Diagnosis Date Noted  . Open pilon fracture 07/28/2014   History reviewed. No pertinent past medical history.  Family History  Problem Relation Age of Onset  . Other Neg Hx     Past Surgical History:  Procedure Laterality Date  . CESAREAN SECTION    . CESAREAN SECTION  07/18/2012   Procedure: CESAREAN SECTION;  Surgeon: Mickel Baas, MD;  Location: [facility];  Service: Gynecology;  Laterality:  N/A;  Repeat cesarean section with delivery of baby boy at [id number]. Apgars 9/10. Bilateral tubal ligation with filshie clips  . ORIF ANKLE FRACTURE Right 07/27/2014   Procedure: OPEN REDUCTION INTERNAL FIXATION (ORIF) ANKLE FRACTURE;  Surgeon: Cammy CopaGregory Scott Carrye Goller, MD;  [facility];  Service: Orthopedics;  Laterality: Right;   Social History   Occupational History  . Not on file  Tobacco Use  . Smoking status: Never Smoker  . Smokeless tobacco: Never Used  Substance and Sexual Activity  . Alcohol use: No  . Drug use: No  . Sexual activity: Not on file

## 2022-04-22 ENCOUNTER — Ambulatory Visit (INDEPENDENT_AMBULATORY_CARE_PROVIDER_SITE_OTHER): Payer: 59 | Admitting: Orthopedic Surgery

## 2022-04-22 ENCOUNTER — Encounter: Payer: Self-pay | Admitting: Orthopedic Surgery

## 2022-04-22 ENCOUNTER — Ambulatory Visit (INDEPENDENT_AMBULATORY_CARE_PROVIDER_SITE_OTHER): Payer: 59

## 2022-04-22 DIAGNOSIS — Z9889 Other specified postprocedural states: Secondary | ICD-10-CM | POA: Diagnosis not present

## 2022-04-22 NOTE — Progress Notes (Signed)
? ?Office Visit Note ?  ?Patient: Chelsea Lawrence           ?Date of Birth: 06/25/1981           ?MRN: [id number] ?Visit Date: 04/22/2022 ?Requested by: Loyal Jacobson, MD ?[address] ?[address] ?[place],  [place] [address] ?PCP: Loyal Jacobson, MD ? ?Subjective: ?Chief Complaint  ?Patient presents with  ? Right Ankle - Follow-up  ? ? ?HPI: Patient presents for evaluation of right ankle.  She had right ankle surgery for fracture about 8 years ago.  She wants to have it rechecked to make sure the hardware is in good position.  Patient is thin.  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  Overall she feels normal.  She can walk up to 5 miles a day.  She does report some very sporadic swelling along the lateral aspect of the right ankle but no pain and no restriction of motion or activities. ?             ?ROS: All systems reviewed are negative as they relate to the chief complaint within the history of present illness.  Patient denies  fevers or chills. ? ? ?Assessment & Plan: ?Visit Diagnoses:  ?1. History of ankle surgery   ? ? ?Plan: Impression is excellent range of motion of the right ankle with no hardware complications.  She is thin so the plate on the lateral side may feel somewhat prominent to her.  This could be removed but she may want to do that after summer activities with her family.  She will call to get that scheduled.  Follow-up as needed. ? ?Follow-Up Instructions: Return if symptoms worsen or fail to improve.  ? ?Orders:  ?Orders Placed This Encounter  ?Procedures  ? XR Ankle Complete Right  ? ?No orders of the defined types were placed in this encounter. ? ? ? ? Procedures: ?No procedures performed ? ? ?Clinical Data: ?No additional findings. ? ?Objective: ?Vital Signs: There were no vitals taken for this visit. ? ?Physical Exam:  ? ?Constitutional: Patient appears well-developed ?HEENT:  ?Head: Normocephalic ?Eyes:EOM are normal ?Neck: Normal range of  motion ?Cardiovascular: Normal rate ?Pulmonary/chest: Effort normal ?Neurologic: Patient is alert ?Skin: Skin is warm ?Psychiatric: Patient has normal mood and affect ? ? ?Ortho Exam: Ortho exam demonstrates well-healed surgical incisions on the medial lateral aspect of the ankle.  She has excellent dorsiflexion to 20 degrees past neutral and plantarflexion 30 degrees past neutral.  Slight swelling laterally over the lateral malleolus but no definitively palpable prominent hardware.  Syndesmosis stable. ? ?Specialty Comments:  ?No specialty comments available. ? ?Imaging: ?XR Ankle Complete Right ? ?Result Date: 04/22/2022 ?AP lateral and mortise radiographs right ankle reviewed.  Plate and screw fixation of bimalleolar ankle fracture with distal tibia involvement is visualized.  Mild degenerative changes noted within symmetric mortise.  No acute fracture.  Slight spurring anteriorly seen on the lateral view.  No hardware complications.  ? ? ?PMFS History: ?Patient Active Problem List  ? Diagnosis Date Noted  ? Open pilon fracture 07/28/2014  ? ?History reviewed. No pertinent past medical history.  ?Family History  ?Problem Relation Age of Onset  ? Other Neg Hx   ?  ?Past Surgical History:  ?Procedure Laterality Date  ? CESAREAN SECTION    ? CESAREAN SECTION  07/18/2012  ? Procedure: CESAREAN SECTION;  Surgeon: Mickel Baas, MD;  Location: [facility];  Service: Gynecology;  Laterality: N/A;  Repeat  cesarean section with delivery of baby boy at 17. Apgars 9/10. Bilateral tubal ligation with filshie clips  ? ORIF ANKLE FRACTURE Right 07/27/2014  ? Procedure: OPEN REDUCTION INTERNAL FIXATION (ORIF) ANKLE FRACTURE;  Surgeon: Cammy Copa, MD;  Location: [facility] OR;  Service: Orthopedics;  Laterality: Right;  ? ?Social History  ? ?Occupational History  ? Not on file  ?Tobacco Use  ? Smoking status: Never  ? Smokeless tobacco: Never  ?Substance and Sexual Activity  ? Alcohol use: No  ? Drug use: No  ? Sexual activity: Not  on file  ? ? ? ? ? ?

## 2023-03-03 NOTE — Telephone Encounter (Signed)
Patient would like lab work place before appt so you guys can discuss some things

## 2023-03-04 ENCOUNTER — Encounter

## 2023-03-05 ENCOUNTER — Encounter

## 2023-03-05 LAB — CBC WITH AUTO DIFFERENTIAL
Absolute Baso #: 0 10*3/uL (ref 0.0–0.2)
Absolute Eos #: 0 10*3/uL (ref 0.0–0.5)
Absolute Lymph #: 1.5 10*3/uL (ref 1.0–3.2)
Absolute Mono #: 0.3 10*3/uL (ref 0.3–1.0)
Basophils %: 0.2 % (ref 0.0–2.0)
Eosinophils %: 0.6 % (ref 0.0–7.0)
Hematocrit: 38.4 % (ref 34.0–47.0)
Hemoglobin: 12.2 g/dL (ref 11.5–15.7)
Immature Grans (Abs): 0.02 10*3/uL (ref 0.00–0.06)
Immature Granulocytes: 0.4 % (ref 0.0–0.6)
Lymphocytes: 27.4 % (ref 15.0–45.0)
MCH: 27.1 pg (ref 27.0–34.5)
MCHC: 31.8 g/dL — ABNORMAL LOW (ref 32.0–36.0)
MCV: 85.1 fL (ref 81.0–99.0)
MPV: 10.7 fL (ref 7.2–13.2)
Monocytes: 5.5 % (ref 4.0–12.0)
NRBC Absolute: 0 10*3/uL (ref 0.000–0.012)
NRBC Automated: 0 % (ref 0.0–0.2)
Neutrophils %: 65.9 % (ref 42.0–74.0)
Neutrophils Absolute: 3.6 10*3/uL (ref 1.6–7.3)
Platelets: 251 10*3/uL (ref 140–440)
RBC: 4.51 x10e6/mcL (ref 3.60–5.20)
RDW: 14.6 % (ref 11.0–16.0)
WBC: 5.4 10*3/uL (ref 3.8–10.6)

## 2023-03-05 LAB — COMPREHENSIVE METABOLIC PANEL
ALT: 11 U/L (ref 0–35)
AST: 18 U/L (ref 0–35)
Albumin/Globulin Ratio: 1.7 (ref 1.00–2.70)
Albumin: 4.5 g/dL (ref 3.5–5.2)
Alk Phosphatase: 56 U/L (ref 35–117)
Anion Gap: 15 mmol/L (ref 2–17)
BUN: 9 mg/dL (ref 6–20)
CO2: 21 mmol/L — ABNORMAL LOW (ref 22–29)
Calcium: 9 mg/dL (ref 8.5–10.7)
Chloride: 101 mmol/L (ref 98–107)
Creatinine: 0.6 mg/dL (ref 0.5–1.0)
Est, Glom Filt Rate: 116 mL/min/1.73m (ref >=60)
Globulin: 2.7 g/dL (ref 1.9–4.4)
Glucose: 98 mg/dL (ref 70–99)
OSMOLALITY CALCULATED: 272 mOsm/kg (ref 270–287)
Potassium: 4.3 mmol/L (ref 3.5–5.3)
Sodium: 137 mmol/L (ref 135–145)
Total Bilirubin: 0.56 mg/dL (ref 0.00–1.20)
Total Protein: 7.2 g/dL (ref 5.7–8.3)

## 2023-03-05 LAB — HEMOGLOBIN A1C
Est. Avg. Glucose, WB: 128
Est. Avg. Glucose-calculated: 140
Hemoglobin A1C: 6.1 % — ABNORMAL HIGH (ref 4.0–6.0)

## 2023-03-05 LAB — HEPATITIS C ANTIBODY: Hepatitis C Ab: NEGATIVE

## 2023-03-05 LAB — LIPID PANEL
Chol/HDL Ratio: 4.6 — ABNORMAL HIGH (ref 0.0–4.4)
Cholesterol: 195 mg/dL (ref 100–200)
HDL: 42 mg/dL — ABNORMAL LOW (ref >=50)
LDL Cholesterol: 129.8 mg/dL — ABNORMAL HIGH (ref 0.0–100.0)
LDL/HDL Ratio: 3.1
Triglycerides: 116 mg/dL (ref 0–149)
VLDL: 23.2 mg/dL (ref 5.0–40.0)

## 2023-03-05 LAB — TSH WITH REFLEX: TSH: 3.16 mcIU/mL (ref 0.358–3.740)

## 2023-03-05 LAB — VITAMIN D 25 HYDROXY: Vit D, 25-Hydroxy: 40.7 ng/mL (ref 30.0–90.0)

## 2023-03-05 LAB — HIV SCREEN: HIV Screen: NEGATIVE

## 2023-03-05 LAB — VITAMIN B12: Vitamin B-12: 485 pg/mL (ref 232–1245)

## 2023-03-07 ENCOUNTER — Encounter: Admit: 2023-03-07 | Discharge: 2023-03-07 | Payer: PRIVATE HEALTH INSURANCE

## 2023-03-07 DIAGNOSIS — Z0001 Encounter for general adult medical examination with abnormal findings: Secondary | ICD-10-CM

## 2023-03-07 MED ORDER — VITAMIN D (ERGOCALCIFEROL) 1.25 MG (50000 UT) PO CAPS
1.25 MG (50000 UT) | ORAL_CAPSULE | ORAL | 3 refills | Status: DC
Start: 2023-03-07 — End: 2024-03-22

## 2023-03-07 NOTE — Progress Notes (Unsigned)
[facility]  Meredith Najjar, DO   [address] [place] [address]  Phone:  [phone]  Fax:  [phone]    CHIEF COMPLAINT:  No chief complaint on file.       HISTORY OF PRESENT ILLNESS:  Meredith Patterson is a 42 y.o. female  who presents to establish care.     REVIEW OF SYSTEMS:  Review of Systems   Constitutional:  Negative for chills and fever.   HENT:  Negative for congestion, postnasal drip, sneezing and sore throat.    Eyes:  Negative for discharge, itching and visual disturbance.   Respiratory:  Negative for cough, chest tightness and shortness of breath.    Cardiovascular:  Negative for chest pain and palpitations.   Gastrointestinal:  Negative for abdominal pain, constipation and diarrhea.   Endocrine: Negative.    Genitourinary:  Negative for dysuria and hematuria.   Musculoskeletal:  Negative for arthralgias and joint swelling.   Skin:  Negative for pallor and rash.   Allergic/Immunologic: Negative for environmental allergies.   Neurological:  Negative for dizziness and headaches.   Hematological: Negative.    Psychiatric/Behavioral:  Negative for hallucinations. The patient is not nervous/anxious.         PHYSICAL EXAM:  Vital Signs:  There were no vitals filed for this visit.       Physical Exam  Vitals and nursing note reviewed.   Constitutional:       General: She is not in acute distress.     Appearance: Normal appearance.   HENT:      Head: Normocephalic and atraumatic.      Right Ear: External ear normal. There is no impacted cerumen.      Left Ear: External ear normal. There is no impacted cerumen.      Nose: Nose normal. No congestion or rhinorrhea.      Mouth/Throat:      Mouth: Mucous membranes are moist.      Pharynx: Oropharynx is clear. No oropharyngeal exudate or posterior oropharyngeal erythema.   Eyes:      General: No scleral icterus.        Right eye: No discharge.         Left eye: No discharge.      Extraocular Movements: Extraocular movements  intact.      Conjunctiva/sclera: Conjunctivae normal.      Pupils: Pupils are equal, round, and reactive to light.   Neck:      Vascular: No carotid bruit.   Cardiovascular:      Rate and Rhythm: Normal rate and regular rhythm.      Pulses: Normal pulses.      Heart sounds: No murmur heard.     No friction rub. No gallop.   Pulmonary:      Effort: Pulmonary effort is normal.      Breath sounds: Normal breath sounds. No wheezing, rhonchi or rales.   Abdominal:      General: Bowel sounds are normal.      Palpations: Abdomen is soft. There is no mass.      Tenderness: There is no abdominal tenderness. There is no guarding.   Musculoskeletal:         General: No swelling or tenderness. Normal range of motion.      Cervical back: Neck supple. No rigidity.      Right lower leg: No edema.      Left lower leg: No edema.  Lymphadenopathy:      Cervical: No cervical adenopathy.   Skin:     General: Skin is warm and dry.      Findings: No rash.   Neurological:      General: No focal deficit present.      Mental Status: She is alert and oriented to person, place, and time.   Psychiatric:         Mood and Affect: Mood normal.         Behavior: Behavior normal.         Thought Content: Thought content normal.          PHQ:       No data to display                Orders Only on 03/05/2023   Component Date Value Ref Range Status    HIV Screen 03/05/2023 Negative  Negative Final    Hepatitis C Ab 03/05/2023 Negative  Negative Final    WBC 03/05/2023 5.4  3.8 - 10.6 x10e3/mcL Final    RBC 03/05/2023 4.51  3.60 - 5.20 x10e6/mcL Final    Hemoglobin 03/05/2023 12.2  11.5 - 15.7 g/dL Final    Hematocrit 16/09/9603 38.4  34.0 - 47.0 % Final    MCV 03/05/2023 85.1  81.0 - 99.0 fL Final    MCH 03/05/2023 27.1  27.0 - 34.5 pg Final    MCHC 03/05/2023 31.8 (L)  32.0 - 36.0 g/dL Final    RDW 54/08/8118 14.6  11.0 - 16.0 % Final    Platelets 03/05/2023 251  140 - 440 x10e3/mcL Final    MPV 03/05/2023 10.7  7.2 - 13.2 fL Final    NRBC Automated  03/05/2023 0.0  0.0 - 0.2 % Final    NRBC Absolute 03/05/2023 0.000  0.000 - 0.012 x10e3/mcL Final    Neutrophils % 03/05/2023 65.9  42.0 - 74.0 % Final    Lymphocytes 03/05/2023 27.4  15.0 - 45.0 % Final    Monocytes 03/05/2023 5.5  4.0 - 12.0 % Final    Eosinophils % 03/05/2023 0.6  0.0 - 7.0 % Final    Basophils % 03/05/2023 0.2  0.0 - 2.0 % Final    Neutrophils Absolute 03/05/2023 3.6  1.6 - 7.3 x10e3/mcL Final    Absolute Lymph # 03/05/2023 1.5  1.0 - 3.2 x10e3/mcL Final    Absolute Mono # 03/05/2023 0.3  0.3 - 1.0 x10e3/mcL Final    Absolute Eos # 03/05/2023 0.0  0.0 - 0.5 x10e3/mcL Final    Absolute Baso # 03/05/2023 0.0  0.0 - 0.2 x10e3/mcL Final    Immature Granulocytes 03/05/2023 0.4  0.0 - 0.6 % Final    Immature Grans (Abs) 03/05/2023 0.02  0.00 - 0.06 x10e3/mcL Final        The ASCVD Risk score (Arnett DK, et al., 2019) failed to calculate for the following reasons:    The systolic blood pressure is missing    Cannot find a previous HDL lab    Cannot find a previous total cholesterol lab    The smoking status is invalid    Unable to determine if patient is Non-Hispanic African American     IMPRESSION/PLAN:  There are no diagnoses linked to this encounter.      Celine Mans Kerensa Nicklas, DO    This note was typed and/or dictated by Dragon Naturally Speaking at the point-of-care.  Reasonable attempt at proofreading has been made; however, occasional wrong-word or `sound-a-like substitutions  persist due to the inherent limitations of voice recognition software.  Please notify the There[facility] Parkin if any discrepancies are noted or if the meaning of any statement is unclear.

## 2023-04-24 ENCOUNTER — Inpatient Hospital Stay: Admit: 2023-04-24 | Payer: PRIVATE HEALTH INSURANCE

## 2023-04-24 DIAGNOSIS — Z1231 Encounter for screening mammogram for malignant neoplasm of breast: Secondary | ICD-10-CM

## 2023-07-09 NOTE — Telephone Encounter (Signed)
Patient has to go out of town for family emergency and had to cancel 07/14/23 NP appt. She would like to be rescheduled with Dr. Derrek Monaco between 07/15/23 and November.

## 2023-07-09 NOTE — Telephone Encounter (Signed)
Spoke to pt resched appt to 08/26

## 2023-07-14 ENCOUNTER — Encounter: Attending: Obstetrics & Gynecology

## 2023-08-11 ENCOUNTER — Ambulatory Visit: Admit: 2023-08-11 | Discharge: 2023-08-11 | Payer: PRIVATE HEALTH INSURANCE | Attending: Obstetrics & Gynecology

## 2023-08-11 DIAGNOSIS — Z01419 Encounter for gynecological examination (general) (routine) without abnormal findings: Secondary | ICD-10-CM

## 2023-08-11 NOTE — Progress Notes (Unsigned)
PATIENT NAME:  Meredith Patterson     DATE OF BIRTH: 25-Jul-1981    REASON FOR VISIT:  annual Gyn exam.   Subjective       LMP: No LMP recorded.     Last Colon Cancer Screening:      OB History   Gravida Para Term Preterm AB Living   2 2 2          SAB IAB Ectopic Molar Multiple Live Births             2      # Outcome Date GA Lbr Len/2nd Weight Sex Type Anes PTL Lv   2 Term            1 Term                    Past Medical History:   Diagnosis Date    Hyperlipidemia     Prediabetes             Past Surgical History:   Procedure Laterality Date    ANKLE SURGERY      CESAREAN SECTION      WISDOM TOOTH EXTRACTION             Family History   Problem Relation Age of Onset    Diabetes Father            Social History     Socioeconomic History    Marital status: Married     Spouse name: Not on file    Number of children: Not on file    Years of education: Not on file    Highest education level: Not on file   Occupational History    Not on file   Tobacco Use    Smoking status: Never    Smokeless tobacco: Never   Vaping Use    Vaping status: Never Used   Substance and Sexual Activity    Alcohol use: Never    Drug use: Never    Sexual activity: Yes     Partners: Male   Other Topics Concern    Not on file   Social History Narrative    Not on file     Social Determinants of Health     Financial Resource Strain: Not on file   Food Insecurity: Not on file   Transportation Needs: Not on file   Physical Activity: Not on file   Stress: Not on file   Social Connections: Not on file   Intimate Partner Violence: Not on file   Housing Stability: Not on file           No Known Allergies        Review of Systems    Constitutional:  No fevers or chills    Neuro:  No headaches, seizure activity    HEENT: no visual changes, bleeding gums    CV: No chest pain or palpitations    Resp: No SOB or cough    Breast:  No masses, pain, D/C, bleeding    GI:  No nausea/vomiting/diarrhea/constipation    GU: No dysuria or hematuria    MS:  No back, point  pain    Gyn:  No menstrual complaints, pelvic pain    Psych:  No depression, anxiety      Objective       There were no vitals taken for this visit.   There is no height or weight on file to calculate BMI.  Physical Exam    General:  well developed, well nourished, in no acute distress     Head:   normocephalic and atraumatic     Neck:  no masses, thyromegaly, or abnormal cervical nodes     Chest Wall:  no deformities     Breasts: breast symetrical w/o masses,skin changes,dimpling,or nipple discharge     Lungs:  clear bilaterally with normal respirations     Heart:   regular rate and rhythm, S1, S2 without murmurs     Abdomen:  bowel sounds positive; abdomen soft and non-tender without masses, organomegaly, or hernias noted     Extremities: no clubbing, cyanosis, edema, or deformity noted with normal full range of motion of all joints     Neurologic:  no focal deficits,normal coordination, muscle strength and tone     Skin:   intact without lesions or rashes     Psych:  alert and cooperative; normal mood and affect; normal attention span and concentration    Pelvic Exam:       External: normal female genitalia without lesions or masses       Vagina: normal without lesions or masses         Urethra: normal      Urethral Meatus: normal       Bladder: normal       Cervix: normal without lesions or masses       Adnexa: normal bimanual exam without masses or fullness       Uterus: Normal size non tender      Pap smear: performed       Assessment/Plan     There are no diagnoses linked to this encounter.

## 2023-08-14 LAB — PAP IG, APTIMA HPV AND RFX 16/18,45 (199305)
.: 0
HPV Aptima: NEGATIVE

## 2023-09-09 ENCOUNTER — Encounter

## 2023-09-09 LAB — COMPREHENSIVE METABOLIC PANEL
ALT: 10 U/L (ref 0–35)
AST: 17 U/L (ref 0–35)
Albumin/Globulin Ratio: 1.5 (ref 1.00–2.70)
Albumin: 4.4 g/dL (ref 3.5–5.2)
Alk Phosphatase: 51 U/L (ref 35–117)
Anion Gap: 11 mmol/L (ref 2–17)
BUN: 7 mg/dL (ref 6–20)
CO2: 24 mmol/L (ref 22–29)
Calcium: 9 mg/dL (ref 8.5–10.7)
Chloride: 104 mmol/L (ref 98–107)
Creatinine: 0.7 mg/dL (ref 0.5–1.0)
Est, Glom Filt Rate: 112 mL/min/1.73mÂ² (ref >=60)
Globulin: 2.9 g/dL (ref 1.9–4.4)
Glucose: 90 mg/dL (ref 70–99)
Osmolaliy Calculated: 275 mosm/kg (ref 270–287)
Potassium: 4.2 mmol/L (ref 3.5–5.3)
Sodium: 139 mmol/L (ref 135–145)
Total Bilirubin: 0.65 mg/dL (ref 0.00–1.20)
Total Protein: 7.3 g/dL (ref 5.7–8.3)

## 2023-09-09 LAB — HEMOGLOBIN A1C
Estimated Avg Glucose: 123
Estimated Avg Glucose: 133
Hemoglobin A1C: 5.9 % (ref 4.0–6.0)

## 2023-09-09 LAB — LIPID PANEL
Chol/HDL Ratio: 4.1 (ref 0.0–4.4)
Cholesterol, Total: 194 mg/dL (ref 100–200)
HDL: 47 mg/dL — ABNORMAL LOW (ref >=50)
LDL Cholesterol: 130.2 mg/dL — ABNORMAL HIGH (ref 0.0–100.0)
LDL/HDL Ratio: 2.8
Triglycerides: 84 mg/dL (ref 0–149)
VLDL: 16.8 mg/dL (ref 5.0–40.0)

## 2023-09-12 ENCOUNTER — Encounter

## 2023-09-12 NOTE — Progress Notes (Deleted)
 [facility]  Meredith Jamse Ester, DO   [address] [place] [address]  Phone:  [phone]  Fax:  [phone]    CHIEF COMPLAINT:  No chief complaint on file.       HISTORY OF PRESENT ILLNESS:  Meredith Patterson is a 42 y.o. female  who presents for follow-up.    REVIEW OF SYSTEMS:  Review of Systems   Constitutional:  Negative for chills and fever.   HENT:  Negative for congestion, postnasal drip, sneezing and sore throat.    Eyes:  Negative for discharge, itching and visual disturbance.   Respiratory:  Negative for cough, chest tightness and shortness of breath.    Cardiovascular:  Negative for chest pain and palpitations.   Gastrointestinal:  Negative for abdominal pain, constipation and diarrhea.   Endocrine: Negative.    Genitourinary:  Negative for dysuria and hematuria.   Musculoskeletal:  Negative for arthralgias and joint swelling.   Skin:  Negative for pallor and rash.   Allergic/Immunologic: Negative for environmental allergies.   Neurological:  Negative for dizziness and headaches.   Hematological: Negative.    Psychiatric/Behavioral:  Negative for dysphoric mood. The patient is not nervous/anxious.       PHYSICAL EXAM:  Vital Signs:  There were no vitals filed for this visit.     Physical Exam  Vitals and nursing note reviewed.   Constitutional:       General: She is not in acute distress.     Appearance: Normal appearance.   HENT:      Head: Normocephalic and atraumatic.      Right Ear: External ear normal. There is no impacted cerumen.      Left Ear: External ear normal. There is no impacted cerumen.      Nose: Nose normal. No congestion or rhinorrhea.      Mouth/Throat:      Mouth: Mucous membranes are moist.      Pharynx: Oropharynx is clear. No oropharyngeal exudate or posterior oropharyngeal erythema.   Eyes:      General: No scleral icterus.        Right eye: No discharge.         Left eye: No discharge.      Extraocular Movements: Extraocular movements intact.       Conjunctiva/sclera: Conjunctivae normal.      Pupils: Pupils are equal, round, and reactive to light.   Neck:      Vascular: No carotid bruit.   Cardiovascular:      Rate and Rhythm: Normal rate and regular rhythm.      Pulses: Normal pulses.      Heart sounds: No murmur heard.     No friction rub. No gallop.   Pulmonary:      Effort: Pulmonary effort is normal.      Breath sounds: Normal breath sounds. No wheezing, rhonchi or rales.   Abdominal:      General: Bowel sounds are normal.      Palpations: Abdomen is soft. There is no mass.      Tenderness: There is no abdominal tenderness. There is no guarding.   Musculoskeletal:         General: No swelling or tenderness. Normal range of motion.      Cervical back: Neck supple. No rigidity.      Right lower leg: No edema.      Left lower leg: No edema.   Lymphadenopathy:  Cervical: No cervical adenopathy.   Skin:     General: Skin is warm and dry.      Findings: No rash.   Neurological:      General: No focal deficit present.      Mental Status: She is alert and oriented to person, place, and time.   Psychiatric:         Mood and Affect: Mood normal.         Behavior: Behavior normal.         Thought Content: Thought content normal.        PHQ:      08/11/2023     1:15 PM   PHQ-9    Little interest or pleasure in doing things 0   Feeling down, depressed, or hopeless 0   PHQ-2 Score 0   PHQ-9 Total Score 0       Office Visit on 08/11/2023   Component Date Value Ref Range Status    Diagnosis: 08/11/2023 Comment   Final    NEGATIVE FOR INTRAEPITHELIAL LESION OR MALIGNANCY.    Specimen adequacy: 08/11/2023 Comment   Final    Comment: Satisfactory for evaluation. Endocervical and/or squamous metaplastic  cells (endocervical component) are present.      Clinician Provided ICD 08/11/2023 Comment   Final    Z12.4    Performed by: 08/11/2023 Comment   Final    Wellington Bergeron, Supervisory Cytotechnologist (ASCP)    . 08/11/2023 .   Final    Note: 08/11/2023 Comment   Final     Comment: The Pap smear is a screening test designed to aid in the detection of  premalignant and malignant conditions of the uterine cervix.  It is not a  diagnostic procedure and should not be used as the sole means of detecting  cervical cancer.  Both false-positive and false-negative reports do occur.      Methodology: 08/11/2023 Comment   Final    Comment: This liquid based ThinPrep(R) pap test was screened with the  use of an image guided system.      HPV Aptima 08/11/2023 Negative  Negative Final    Comment: This nucleic acid amplification test detects fourteen high-risk  HPV types (16,18,31,33,35,39,45,51,52,56,58,59,66,68) without  differentiation.      HPV Genotype Reflex 08/11/2023 Comment   Final    Criteria not met, HPV Genotype not performed.        The ASCVD Risk score (Arnett DK, et al., 2019) failed to calculate for the following reasons:    Unable to determine if patient is Non-Hispanic African American     IMPRESSION/PLAN:  There are no diagnoses linked to this encounter.      Meredith Gutting Rehmat Murtagh, DO    This note was typed and/or dictated by Dragon Naturally Speaking at the point-of-care.  Reasonable attempt at proofreading has been made; however, occasional wrong-word or `sound-a-like substitutions persist due to the inherent limitations of voice recognition software.  Please notify the dino if any discrepancies are noted or if the meaning of any statement is unclear.

## 2023-09-16 ENCOUNTER — Ambulatory Visit: Admit: 2023-09-16 | Discharge: 2023-09-16 | Payer: PRIVATE HEALTH INSURANCE

## 2023-09-16 DIAGNOSIS — M62838 Other muscle spasm: Secondary | ICD-10-CM

## 2023-09-16 MED ORDER — CYCLOBENZAPRINE HCL 10 MG PO TABS
10 | ORAL_TABLET | Freq: Every evening | ORAL | 0 refills | Status: AC
Start: 2023-09-16 — End: 2023-11-15

## 2023-09-16 MED ORDER — CYANOCOBALAMIN 1000 MCG PO TABS
1000 MCG | ORAL_TABLET | Freq: Every day | ORAL | 3 refills | Status: DC
Start: 2023-09-16 — End: 2024-03-22

## 2023-09-16 MED ORDER — FERROUS GLUCONATE 324 (38 FE) MG PO TABS
324 | ORAL_TABLET | Freq: Every day | ORAL | 3 refills | Status: AC
Start: 2023-09-16 — End: 2024-10-20

## 2023-09-16 NOTE — Progress Notes (Signed)
 [facility]  Nat Jamse Ester, DO   [address], [place] [address]  Phone:  [phone]  Fax:  [phone]    CHIEF COMPLAINT:  Chief Complaint   Patient presents with    6 Month Follow-Up    Discuss Labs        HISTORY OF PRESENT ILLNESS:  Ms. Gilles is a 42 y.o. female  who presents for follow-up.  This week's labs demonstrate total cholesterol of 194 and elevated LDL of 130.  Patient has been working on a low-fat diet for cholesterol.  A1c is down from 6.1 to 5.9 on this week's labs.  Recent pelvic and Pap were normal, negative HPV status.  Supplementing vitamin B12 and D for history of deficiency, levels are currently therapeutic.  Patient complains of some viral warts and calluses on her feet and requests referral to podiatry.  Complains of chronic, intermittent neck pain for the past 6 months.  No injury prior to onset.  Says it feels like muscle spasms on both sides of her neck going into her shoulders.  No neurological symptoms going into the upper extremities.  Due for influenza vaccine.     REVIEW OF SYSTEMS:  Review of Systems   Constitutional:  Negative for chills and fever.   HENT:  Negative for congestion, postnasal drip, sneezing and sore throat.    Eyes:  Negative for discharge, itching and visual disturbance.   Respiratory:  Negative for cough, chest tightness and shortness of breath.    Cardiovascular:  Negative for chest pain and palpitations.   Gastrointestinal:  Negative for abdominal pain, constipation and diarrhea.   Endocrine: Negative.    Genitourinary:  Negative for dysuria and hematuria.   Musculoskeletal:  Positive for neck pain. Negative for arthralgias and joint swelling.        +muscle spasms on both sides of lower neck   Skin:  Negative for pallor and rash.        +calluses and warts on both feet   Allergic/Immunologic: Negative for environmental allergies.   Neurological:  Negative for dizziness and headaches.   Hematological: Negative.     Psychiatric/Behavioral:  Negative for dysphoric mood. The patient is not nervous/anxious.       PHYSICAL EXAM:  Vital Signs:  Vitals:    09/16/23 0854   BP: 110/72   Pulse: 89   SpO2: 98%   Weight: 60.7 kg (133 lb 12.8 oz)   Height: 1.651 m (5' 5)        Physical Exam  Vitals and nursing note reviewed.   Constitutional:       General: She is not in acute distress.     Appearance: Normal appearance.   HENT:      Head: Normocephalic and atraumatic.      Right Ear: External ear normal. There is no impacted cerumen.      Left Ear: External ear normal. There is no impacted cerumen.      Nose: Nose normal. No congestion or rhinorrhea.      Mouth/Throat:      Mouth: Mucous membranes are moist.      Pharynx: Oropharynx is clear. No oropharyngeal exudate or posterior oropharyngeal erythema.   Eyes:      General: No scleral icterus.        Right eye: No discharge.         Left eye: No discharge.      Extraocular Movements: Extraocular movements  intact.      Conjunctiva/sclera: Conjunctivae normal.      Pupils: Pupils are equal, round, and reactive to light.   Neck:      Vascular: No carotid bruit.   Cardiovascular:      Rate and Rhythm: Normal rate and regular rhythm.      Pulses: Normal pulses.      Heart sounds: No murmur heard.     No friction rub. No gallop.   Pulmonary:      Effort: Pulmonary effort is normal.      Breath sounds: Normal breath sounds. No wheezing, rhonchi or rales.   Abdominal:      General: Bowel sounds are normal.      Palpations: Abdomen is soft. There is no mass.      Tenderness: There is no abdominal tenderness. There is no guarding.   Musculoskeletal:         General: Tenderness present. No swelling. Normal range of motion.      Cervical back: Neck supple. No rigidity.      Right lower leg: No edema.      Left lower leg: No edema.      Comments: +palpable somatic dysfunction of lower cervical region, +hypertonic, tender trapezius muscles bilaterally   Lymphadenopathy:      Cervical: No cervical  adenopathy.   Skin:     General: Skin is warm and dry.      Findings: No rash.      Comments: +scattered plantar's warts and calluses on plantar aspect of both feet   Neurological:      General: No focal deficit present.      Mental Status: She is alert and oriented to person, place, and time.   Psychiatric:         Mood and Affect: Mood normal.         Behavior: Behavior normal.         Thought Content: Thought content normal.        PHQ:      09/16/2023     8:57 AM   PHQ-9    Little interest or pleasure in doing things 0   Feeling down, depressed, or hopeless 0   PHQ-2 Score 0   PHQ-9 Total Score 0       Orders Only on 09/09/2023   Component Date Value Ref Range Status    Hemoglobin A1C 09/09/2023 5.9  4.0 - 6.0 % Final    Comment: HEMOGLOBIN A1C INTERPRETATION:    The following arbitrary ranges may be used for interpretation of the results.  However, factors such as duration of diabetes, adherence to therapy, and  patient age should also be considered in assessing degree of blood glucose  control.    Hemoglobin A1C                 Avg. Blood Sugar  --------------------------------------------------------------  6%                           135 mg/dL  7%                           170 mg/dL  8%                           205 mg/dL  9%  240 mg/dL  89%                          275 mg/dL    ======================================================    A1C                      Glucose Control  ----------------------------------------------------------------  < 6.0 %                   Normal  6.0 - 6.9 %               Abnormal  7.0 - 7.9 %               Sub-Optimal Control  > 8.0 %                   Inadequate Control      Estimated Avg Glucose 09/09/2023 123   Final    Estimated Avg Glucose 09/09/2023 133   Final    Cholesterol, Total 09/09/2023 194  100 - 200 mg/dL Final    Comment: The National Cholesterol Education Program has published reference  cholesterol values for cardiovascular risk to  be:    Less than 200 mg/dL     = Low Risk    799 to 239 mg/dL        = Borderline Risk    240mg /dL and greater    = High Risk      HDL 09/09/2023 47 (L)  >=50 mg/dL Final    Comment: The National Lipid Association and the [employer] Cholesterol Education Program  (NCEP) have set the guidelines for high-density lipoprotein (HDL) cholesterol  in adults ages 24 and up.      Triglycerides 09/09/2023 84  0 - 149 mg/dL Final    Comment:   TRIGLYCERIDE INTERPRETATION:                          Recommended Fasting Triglyc Levels for Adults                        =============================================                        Desirable                        < 150 mg/dL                        Average                          < 200 mg/dL                        Borderline High             200 to 500 mg/dL                        Hypertriglyceridemic             > 500 mg/dL                        =============================================      LDL Cholesterol 09/09/2023 130.2 (H)  0.0 - 100.0 mg/dL Final  LDL/HDL Ratio 09/09/2023 2.8   Final    Chol/HDL Ratio 09/09/2023 4.1  0.0 - 4.4 Final    VLDL 09/09/2023 16.8  5.0 - 40.0 mg/dL Final    Sodium 90/75/7975 139  135 - 145 mmol/L Final    Potassium 09/09/2023 4.2  3.5 - 5.3 mmol/L Final    Chloride 09/09/2023 104  98 - 107 mmol/L Final    CO2 09/09/2023 24  22 - 29 mmol/L Final    Glucose 09/09/2023 90  70 - 99 mg/dL Final    BUN 90/75/7975 7  6 - 20 mg/dL Final    Creatinine 90/75/7975 0.7  0.5 - 1.0 mg/dL Final    Anion Gap 90/75/7975 11  2 - 17 mmol/L Final    Osmolaliy Calculated 09/09/2023 275  270 - 287 mOsm/kg Final    Calcium 09/09/2023 9.0  8.5 - 10.7 mg/dL Final    Total Protein 09/09/2023 7.3  5.7 - 8.3 g/dL Final    Albumin 90/75/7975 4.4  3.5 - 5.2 g/dL Final    Globulin 90/75/7975 2.9  1.9 - 4.4 g/dL Final    Albumin/Globulin Ratio 09/09/2023 1.50  1.00 - 2.70 Final    Total Bilirubin 09/09/2023 0.65  0.00 - 1.20 mg/dL Final    Alk Phosphatase 09/09/2023 51   35 - 117 unit/L Final    AST 09/09/2023 17  0 - 35 unit/L Final    ALT 09/09/2023 10  0 - 35 unit/L Final    Est, Glom Filt Rate 09/09/2023 112  >=60 mL/min/1.58m Final    Comment: VERIFIED by Discern Expert.  GFR Interpretation:                                                                         % OF  KIDNEY  GFR                                                        STAGE  FUNCTION  ==================================================================================    > 90        Normal kidney function                       STAGE 1  90-100%  89 to 60      Mild loss of kidney function                 STAGE 2  80-60%  59 to 45      Mild to moderate loss of kidney function     STAGE 3a  59-45%  44 to 30      Moderate to severe loss of kidney function   STAGE 3b  44-30%  29 to 15      Severe loss of kidney function               STAGE 4  29-15%    < 15        Kidney failure  STAGE 5  <15%  ==================================================================================  Modified from National Kidney Foundation    GFR Calculation performed using the CKD-EPI 2021 equation developed for use  with IDMS traceable creatinine methods and                            is the calculation recommended by  the [facility] for estimating GFR in adults.     Office Visit on 08/11/2023   Component Date Value Ref Range Status    Diagnosis: 08/11/2023 Comment   Final    NEGATIVE FOR INTRAEPITHELIAL LESION OR MALIGNANCY.    Specimen adequacy: 08/11/2023 Comment   Final    Comment: Satisfactory for evaluation. Endocervical and/or squamous metaplastic  cells (endocervical component) are present.      Clinician Provided ICD 08/11/2023 Comment   Final    Z12.4    Performed by: 08/11/2023 Comment   Final    Wellington Bergeron, Supervisory Cytotechnologist (ASCP)    . 08/11/2023 .   Final    Note: 08/11/2023 Comment   Final    Comment: The Pap smear is a screening test designed to aid in the detection  of  premalignant and malignant conditions of the uterine cervix.  It is not a  diagnostic procedure and should not be used as the sole means of detecting  cervical cancer.  Both false-positive and false-negative reports do occur.      Methodology: 08/11/2023 Comment   Final    Comment: This liquid based ThinPrep(R) pap test was screened with the  use of an image guided system.      HPV Aptima 08/11/2023 Negative  Negative Final    Comment: This nucleic acid amplification test detects fourteen high-risk  HPV types (16,18,31,33,35,39,45,51,52,56,58,59,66,68) without  differentiation.      HPV Genotype Reflex 08/11/2023 Comment   Final    Criteria not met, HPV Genotype not performed.      IMPRESSION/PLAN:    1. Muscle spasm  Recommend Flexeril  10 mg prior to bedtime until resolution of symptoms  Referred for physical therapy at patient's request  - [facility] - Physical Therapy, [facility]  - cyclobenzaprine  (FLEXERIL ) 10 MG tablet; Take 1 tablet by mouth at bedtime  Dispense: 60 tablet; Refill: 0    2. Iron deficiency anemia secondary to inadequate dietary iron intake  - ferrous gluconate  (FERGON) 324 (38 Fe) MG tablet; Take 1 tablet by mouth daily (with breakfast)  Dispense: 100 tablet; Refill: 3  - CBC with Auto Differential; Future    3. Chronic neck pain  - [facility] - Physical Therapy, [facility]    4. Vitamin D  deficiency  Levels therapeutic, continue current supplementation dosing  - Vitamin D  25 Hydroxy; Future    5. Vitamin B 12 deficiency  Continue current supplementation dosing, rechecking levels and will adjust as needed  - Vitamin B12; Future  - cyanocobalamin  1000 MCG tablet; Take 1 tablet by mouth daily  Dispense: 100 tablet; Refill: 3    6. Hyperlipidemia LDL goal <100  Patient declines medication at this time  Encouraged low-fat diet and regular exercise  - Comprehensive Metabolic Panel; Future  - Lipid Panel; Future  - TSH with Reflex; Future    7. Prediabetes  A1c currently 5.9  Encouraged  low carbohydrate diet and regular exercise  - Hemoglobin A1C; Future  - Microalbumin / Creatinine Urine Ratio; Future    8. Other viral warts  Referred to podiatry at  patient's request  - [facility], DPM - Podiatry    9. Callus of foot  - [facility], DPM - Podiatry    10. Need for influenza vaccination  - Influenza, FLUCELVAX Trivalent, (age 73 mo+) IM, Preservative Free, 0.5mL    Immunizations Administered       Name Date Dose Route    Influenza, FLUCELVAX, (age 73 mo+) IM, Trivalent PF, 0.5mL 09/16/2023 0.5 mL Intramuscular    Site: Deltoid- Left    Lot: [id number]    NDC: 29538-345-95            Nat Gutting Veasna Santibanez, DO    This note was typed and/or dictated by Dragon Naturally Speaking at the point-of-care.  Reasonable attempt at proofreading has been made; however, occasional wrong-word or `sound-a-like substitutions persist due to the inherent limitations of voice recognition software.  Please notify the dino if any discrepancies are noted or if the meaning of any statement is unclear.

## 2023-09-16 NOTE — Progress Notes (Signed)
 Immunizations Administered       Name Date Dose Route    Influenza, FLUCELVAX, (age 41 mo+) IM, Trivalent PF, 0.42mL 09/16/2023 0.5 mL Intramuscular    Site: Deltoid- Left    Lot: [id number]    NDC: [phone]

## 2023-10-06 ENCOUNTER — Inpatient Hospital Stay: Admit: 2023-10-06 | Discharge: 2023-10-06 | Payer: PRIVATE HEALTH INSURANCE

## 2023-10-06 DIAGNOSIS — M542 Cervicalgia: Secondary | ICD-10-CM

## 2023-10-06 NOTE — Other (Signed)
[facility]   [address] [place] [address]  Phone: [phone]  Fax: [phone]  Outpatient Physical Therapy Evaluation Episode    Meredith Patterson   (42 y.o. female)  DOB 09/03/1981    Medical & Insurance Info PT Plan of Care & Visit info   Referring Physician: Peyton Najjar, DO Plan of Care Certification Period 10/06/2023 to 12/29/23   Date of Onset    (6 months ago) Frequency   2x/week  12 weeks   Primary Insurance: [employer]  Secondary Insurance:  PT Visit Info  Total # of Visits to Date: 1  Total # of Visits Approved: 20  Progress Note Counter: 1/10   Medical Diagnosis: Chronic neck pain [M54.2, G89.29]  Muscle spasm [M62.838]  Visit Diagnosis  Visit Diagnoses         Codes    Neck pain on left side    -  Primary M54.2          Past Medical History/Comorbidities  Ms. Mirenda  has a past medical history of Hyperlipidemia, Iron deficiency, Prediabetes, Vitamin B12 deficiency, and Vitamin D deficiency.  Ms. Lite  has a past surgical history that includes Ankle surgery; Cesarean section; Wisdom tooth extraction; Tubal ligation; and Dilation and curettage of uterus.  Allergies Patient has no known allergies.    Learning   Learning  Does the patient/guardian have any barriers to learning?: No barriers  Will there be a co-learner?: No  What is the preferred language of the patient/guardian?: English  Is an interpreter required?: No  Restrictions/Precautions   Restrictions/Precautions: None     SUBJECTIVE   Subjective  Pt. c/o L sided upper trapezius pain that has been present x 6 months s injury or incident.  Pt. cannot recall exactly when her pain started but notes it increases when she uses her computer or has increased housework.  She denies any pain/numbness or tingling in her L arm and has not noted any significant muscle weakness.  She was prescribed a muscle relaxer by her primary MD but has not taken it as she does not feel she needs it at this  time.  She does not take OTC NSAID's at this time either.  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)     Social History    Social History  Lives With: Family  Pain  Pain Screening  Patient Currently in Pain: Yes  Pain Assessment: 0-10  Pain Level: 5  Pain Location: Neck  Pain Orientation: Left   OBJECTIVE       Posture: mild FHP c rounded shoulders    Palpation:tender L upper trap    Neuro screen:       Dermatomes RIGHT LEFT Comment    C2 - Temple/Forehead WNL    WNL       C3 - Neck  WNL    WNL       C4 - Supraclavicular WNL    WNL       C5 - Lateral Shoulder WNL    WNL       C6 - Thumb WNL    WNL       C7 - Middle Finger WNL    WNL       C8 - Little Finger WNL    WNL       T1 - Medial Epicondyle WNL  WNL       T2 - Axilla WNL    WNL           Myotomes RIGHT LEFT  Comment    C2/3/4 Shoulder Shrug WNL Diminished       C5 - Shoulder Abd WNL    WNL       C5/6 - Elbow Flx / Wrist Ext WNL    WNL       C6/7 - Elbow Ext / Wrist Flx WNL    WNL       C8 - Thumb Abduction WNL    WNL       T1 - Finger Abduction WNL    WNL             Cervical AROM Degrees   Flexion (50) 50   Extension (80) 80    Right Left    Sidebending (45) 40 45   Rotation (80) 80 70 c pain            PT Treatment Completed:  Exercises:  Therapeutic exercise (CPT 97110)   Treatment Reasoning    Exercise 1: T-band row/ext/shrug/W Green x 20 ea.  Exercise 2: PROM cervical spine c manual overpressure into rotation and SB c manual scapular depression    Limitations addressed: Mobility, Strength, Posture  Therapist provided: Manual cuing, Verbal cuing   1:1 Time (minutes): 20               ASSESSMENT   AssessmentPt. is a 42 y/o female c L sided neck/upper trap pain consistent c muscular dysfunction.  She has mild cervical ROM limitation and increased pain during ADL's /computer work.  She was treated today c passive cervical ROM as well as an exercise program designed to  strengthen her neck musculature.  She will benefit from PT intervention to restore optimal painfree cervical mobility.  Education  PT Education: Home Exercise Program   Impairments  Decreased ROM, Decreased strength, Increased pain, Decreased posture  Therapy Prognosis  Good  Eval Complexity  Low Complexity  PLAN   Interventions Planned(Treatment may consist of any combination of the following):    Current Treatment Recommendations: Strengthening, ROM, Manual, Home exercise program, Modalities, Dry needling  Modalities: Heat/Cold, Mechanical Traction, Ultrasound, E-stim - unattended  Next Visit Recommendation        Goals  Patient Goal(s):    Short Term Goals Completed by 30 days Goal Status   Pt. will be Indep/compliant c HEP     Full painfree cervical AROM                                     Long Term Goals Completed by 12 weeks Goal Status   Pt. will perform all household duties/compter work s increased neck apin                                         Total Duration   Time In: 1000  Time Out: 1040  Insurance Visit Details   Charge Capture     Therapist Signature: Larene Pickett, PT    Date: 95/62/1308     I certify that the above therapy services are being furnished while the patient is under my care. I agree with the treatment plan and certify that this therapy is necessary.  Physician Signature:Rice, Celine Mans, DO    _______________________________ Date _____________      Please sign and return to [facility].  Please fax to the location listed below. THANK YOU for this referral!    [facility] [place]  [facility]  [address]  [place] [address]  Dept: [phone]  Dept Fax: [phone]  Loc: [phone]

## 2023-10-14 ENCOUNTER — Inpatient Hospital Stay: Admit: 2023-10-14 | Discharge: 2023-10-14 | Payer: PRIVATE HEALTH INSURANCE

## 2023-10-14 NOTE — Progress Notes (Signed)
[facility]   [address]  [place] [address]  Phone: [phone]  Fax: [phone]  Outpatient Physical Therapy TreatmentEpisode    Meredith Patterson   (42 y.o. female)  DOB 1981/06/20    Medical & Insurance Info PT Plan of Care & Visit info   Referring Physician: Peyton Najjar, DO Plan of Care/Certification Expiration Date: 12/29/23     Onset Date:  (6 months ago)   Plan Frequency: 2x/week    Plan weeks: 12 weeks     Primary Insurance: [employer]  Secondary Insurance:  PT Visit Info  Total # of Visits to Date: 2  Total # of Visits Approved: 20    Progress Note Counter: 2   Medical Diagnosis: Chronic neck pain [M54.2, G89.29]  Muscle spasm [M62.838]  No data recorded  Past Medical History/Comorbidities  Ms. Igoe  has a past medical history of Hyperlipidemia, Iron deficiency, Prediabetes, Vitamin B12 deficiency, and Vitamin D deficiency.  Ms. Commer  has a past surgical history that includes Ankle surgery; Cesarean section; Wisdom tooth extraction; Tubal ligation; and Dilation and curettage of uterus.  Allergies   Patient has no known allergies.  Learning   Learning  Does the patient/guardian have any barriers to learning?: No barriers  Will there be a co-learner?: No  What is the preferred language of the patient/guardian?: English  Is an interpreter required?: No      Restrictions/Precautions   Restrictions/Precautions: None   No data recorded  SUBJECTIVE   Subjective  Pt reporting that the pain intensity and frequency is about the same in the L shoulder/UT area. They are doing well with their HEP.  Pain      OBJECTIVE   PT Treatment Completed:  Exercises:  Therapeutic exercise (CPT 97110)   Treatment Reasoning    Exercise 1: Nustep lvl 5 5 min  Exercise 3: T-band row/ext/shrug/W Green x 20 ea.  Exercise 4: Doorway stretch 3x30sec  Exercise 5: SB I's, Y, T's x10 ea    Limitations addressed: Mobility, Strength, Posture  Therapist provided: Manual  cuing, Verbal cuing  Functional ability(s) targeted: Reaching overhead, Tolerance to age appropriate activities   1:1 Time (minutes): 20  Unbillable time (minutes): 5  Total Time: 25           Manual Therapy: (CPT 97140) Treatment Reasoning     Joint Mobilization: Gentle UT and LS stretch  Soft Tissue Mobilizaton: STM to the L UT and occasionally the R UT Limitations addressed: Painful spasm  Functional ability(s) targeted: Tolerance to age appropriate activities   1:1 Time (minutes): 10                Moist Heat: (CPT 97010) Treatment Reasoning    Patient Position: Supine  Moist heat location: Right, Left, Cervical  Post treatment skin assessment: Intact Limitations addressed: Pain modulation  Functional ability(s) targeted: Tolerance to age appropriate activities       Unbillable time (minutes): 8           ASSESSMENT   Assessment:  Reviewed their HEP which the pt is doing well with and had no increase in pain even with the new stretch and exercises. Tried MHP to the neck after therapy to help with dec pain and muscle relaxation and will assess next therapy visit to see how they respond after therapy. Pt would benefit from skilled PT to continue to address neck and shoulder strength and flexbility deficits and dec  pain to improve in pain free functional mobility with daily and recreational activities.  Education      PLAN   Interventions Planned(Treatment may consist of any combination of the following):    Current Treatment Recommendations: Strengthening; ROM; Manual; Home exercise program; Modalities; Dry needling  Modalities: Heat/Cold; Mechanical Traction; Ultrasound; E-stim - unattended    Next Visit Recommendation      Goals  Patient Goal(s):    Short Term Goals    Goal Status                                               Long Term Goals Completed by     Goal Status                                             Total Duration   Individual Time In: 1345       Individual Time Out: 1429  Minutes: 44    Insurance  Visit Details   Production assistant, radio: Deborra Medina, PTA    Date: 10/14/2023

## 2024-03-18 ENCOUNTER — Encounter

## 2024-03-18 LAB — ALBUMIN/CREATININE RATIO, URINE
Albumin Urine: <0.3 mg/dL (ref 0.0–20.0)
Creatinine, Ur: 169 mg/dL (ref 28.0–217.0)

## 2024-03-18 LAB — CBC WITH AUTO DIFFERENTIAL
Basophils %: 0.2 % (ref 0.0–2.0)
Basophils Absolute: 0 10*3/uL (ref 0.0–0.2)
Eosinophils %: 0.9 % (ref 0.0–7.0)
Eosinophils Absolute: 0 10*3/uL (ref 0.0–0.5)
Hematocrit: 38.7 % (ref 34.0–47.0)
Hemoglobin: 12.2 g/dL (ref 11.5–15.7)
Immature Grans (Abs): 0.01 10*3/uL (ref 0.00–0.06)
Immature Granulocytes %: 0.2 % (ref 0.0–0.6)
Lymphocytes Absolute: 1.3 10*3/uL (ref 1.0–3.2)
Lymphocytes: 30.3 % (ref 15.0–45.0)
MCH: 26.5 pg — ABNORMAL LOW (ref 27.0–34.5)
MCHC: 31.5 g/dL (ref 30.0–36.0)
MCV: 84.1 fL (ref 81.0–99.0)
MPV: 11.1 fL (ref 7.0–12.2)
Monocytes %: 5.4 % (ref 4.0–12.0)
Monocytes Absolute: 0.2 10*3/uL — ABNORMAL LOW (ref 0.3–1.0)
NRBC Absolute: 0 10*3/uL (ref 0.000–0.012)
NRBC Automated: 0 % (ref 0.0–0.2)
Neutrophils %: 63 % (ref 42.0–74.0)
Neutrophils Absolute: 2.7 10*3/uL (ref 1.6–7.3)
Platelets: 255 10*3/uL (ref 140–440)
RBC: 4.6 x10e6/mcL (ref 3.60–5.20)
RDW: 15.1 % (ref 10.0–17.0)
WBC: 4.3 10*3/uL (ref 3.8–10.6)

## 2024-03-18 LAB — COMPREHENSIVE METABOLIC PANEL
ALT: 16 U/L (ref 0–42)
AST: 22 U/L (ref 0–46)
Albumin/Globulin Ratio: 1.6 (ref 1.00–2.70)
Albumin: 4.4 g/dL (ref 3.5–5.2)
Alk Phosphatase: 51 U/L (ref 35–117)
Anion Gap: 13 mmol/L (ref 2–17)
BUN: 8 mg/dL (ref 6–20)
CO2: 23 mmol/L (ref 22–29)
Calcium: 9 mg/dL (ref 8.5–10.7)
Chloride: 105 mmol/L (ref 98–107)
Creatinine: 0.7 mg/dL (ref 0.5–1.0)
Est, Glom Filt Rate: 111 mL/min/1.73mÂ² (ref >=60)
Globulin: 2.7 g/dL (ref 1.9–4.4)
Glucose: 95 mg/dL (ref 70–99)
Osmolaliy Calculated: 279 mosm/kg (ref 270–287)
Potassium: 4.2 mmol/L (ref 3.5–5.3)
Sodium: 141 mmol/L (ref 135–145)
Total Bilirubin: 0.59 mg/dL (ref 0.00–1.20)
Total Protein: 7.1 g/dL (ref 5.7–8.3)

## 2024-03-18 LAB — LIPID PANEL
Chol/HDL Ratio: 4 (ref 0.0–4.4)
Cholesterol, Total: 168 mg/dL (ref 100–200)
HDL: 42 mg/dL — ABNORMAL LOW (ref >=50)
LDL Cholesterol: 105 mg/dL — ABNORMAL HIGH (ref 0.0–100.0)
LDL/HDL Ratio: 2.5
Triglycerides: 105 mg/dL (ref 0–149)
VLDL: 21 mg/dL (ref 5.0–40.0)

## 2024-03-18 LAB — HEMOGLOBIN A1C
Estimated Avg Glucose: 120
Estimated Avg Glucose: 129
Hemoglobin A1C: 5.8 % (ref 4.0–6.0)

## 2024-03-18 LAB — TSH REFLEX TO FT4: TSH: 2.48 u[IU]/mL (ref 0.358–3.740)

## 2024-03-18 LAB — VITAMIN B12: Vitamin B-12: 357 pg/mL (ref 232–1245)

## 2024-03-18 LAB — VITAMIN D 25 HYDROXY: Vit D, 25-Hydroxy: 28.2 ng/mL — ABNORMAL LOW (ref 30.0–90.0)

## 2024-03-22 ENCOUNTER — Ambulatory Visit: Admit: 2024-03-22 | Discharge: 2024-03-22 | Payer: PRIVATE HEALTH INSURANCE

## 2024-03-22 VITALS — BP 110/80 | HR 62 | Ht 65.5 in | Wt 135.7 lb

## 2024-03-22 DIAGNOSIS — Z0001 Encounter for general adult medical examination with abnormal findings: Secondary | ICD-10-CM

## 2024-03-22 MED ORDER — VITAMIN D (ERGOCALCIFEROL) 1.25 MG (50000 UT) PO CAPS
1.25 | ORAL_CAPSULE | ORAL | 3 refills | Status: DC
Start: 2024-03-22 — End: 2024-10-13

## 2024-03-22 MED ORDER — CYANOCOBALAMIN 1000 MCG PO TABS
1000 | ORAL_TABLET | Freq: Every day | ORAL | 3 refills | Status: AC
Start: 2024-03-22 — End: 2025-04-26

## 2024-03-22 NOTE — Patient Instructions (Signed)
 Well Visit, Ages 92 to 57: Care Instructions  Well visits can help you stay healthy. Your doctor has checked your overall health and may have suggested ways to take good care of yourself. Your doctor also may have recommended tests. You can help prevent illness with healthy eating, good sleep, vaccinations, regular exercise, and other steps.    Get the tests that you and your doctor decide on. Depending on your age and risks, examples might include screening for diabetes; hepatitis C; HIV; and cervical, breast, lung, and colon cancer. Screening helps find diseases before any symptoms appear.   Eat healthy foods. Choose fruits, vegetables, whole grains, lean protein, and low-fat dairy foods. Limit saturated fat and reduce salt.     Limit alcohol. Men should have no more than 2 drinks a day. Women should have no more than 1. For some people, no alcohol is the best choice.   Exercise. Get at least 30 minutes of exercise on most days of the week. Walking can be a good choice.     Reach and stay at your healthy weight. This will lower your risk for many health problems.   Take care of your mental health. Try to stay connected with friends, family, and community, and find ways to manage stress.     If you're feeling depressed or hopeless, talk to someone. A counselor can help. If you don't have a counselor, talk to your doctor.   Talk to your doctor if you think you may have a problem with alcohol or drug use. This includes prescription medicines, marijuana, and other drugs.     Avoid tobacco and nicotine: Don't smoke, vape, or chew. If you need help quitting, talk to your doctor.   Practice safer sex. Getting tested, using condoms or dental dams, and limiting sex partners can help prevent STIs.     Use birth control if it's important to you to prevent pregnancy. Talk with your doctor about your choices and what might be best for you.   Prevent problems where you can. Protect your skin from too much sun, wash your  hands, brush your teeth twice a day, and wear a seat belt in the car.   Where can you learn more?  Go to [employer] and enter P072 to learn more about "Well Visit, Ages 69 to 20: Care Instructions."  Current as of: April 15, 2023  Content Version: 14.4   2024-2025 [place], [place].   Care instructions adapted under license by [facility] If you have questions about a medical condition or this instruction, always ask your healthcare professional. [place], [facility], disclaims any warranty or liability for your use of this information.

## 2024-03-22 NOTE — Progress Notes (Signed)
 [facility]  Peyton Najjar, DO   [address], [place] [address]  Phone:  [phone]  Fax:  [phone]    CHIEF COMPLAINT:  Chief Complaint   Patient presents with    Annual Exam     CPE        HISTORY OF PRESENT ILLNESS:  Meredith Patterson is a 43 y.o. female  who presents for annual physical.  Supplementing vitamin D and B12 for deficiency and levels are therapeutic.  Has not been taking her oral iron supplementation lately for anemia and hemoglobin levels have stabilized.  A1c has come down to 5.8 with low carbohydrate diet.  Patient has a history of a right ankle fracture that required orthopedic surgery and fixation with hardware.  She requests referral to orthopedics for a check-up to make sure the hardware is in place as it should be.  Denies pain or unstable gait.  Up-to-date on age-appropriate cancer screenings and vaccines.    REVIEW OF SYSTEMS:  Review of Systems   Constitutional:  Negative for chills and fever.   HENT:  Negative for congestion, postnasal drip, sneezing and sore throat.    Eyes:  Negative for discharge, itching and visual disturbance.   Respiratory:  Negative for cough, chest tightness and shortness of breath.    Cardiovascular:  Negative for chest pain and palpitations.   Gastrointestinal:  Negative for abdominal pain, constipation and diarrhea.   Endocrine: Negative.    Genitourinary:  Negative for dysuria and hematuria.   Musculoskeletal:  Negative for arthralgias and joint swelling.   Skin:  Negative for pallor and rash.   Allergic/Immunologic: Negative for environmental allergies.   Neurological:  Negative for dizziness and headaches.   Hematological: Negative.    Psychiatric/Behavioral:  Negative for dysphoric mood. The patient is not nervous/anxious.       PHYSICAL EXAM:  Vital Signs:  Vitals:    03/22/24 1325   BP: 110/80   Pulse: 62   SpO2: 98%   Weight: 61.6 kg (135 lb 11.2 oz)   Height: 1.664 m (5' 5.5")        Physical Exam  Vitals and  nursing note reviewed.   Constitutional:       General: She is not in acute distress.     Appearance: Normal appearance.   HENT:      Head: Normocephalic and atraumatic.      Right Ear: External ear normal. There is no impacted cerumen.      Left Ear: External ear normal. There is no impacted cerumen.      Nose: Nose normal. No congestion or rhinorrhea.      Mouth/Throat:      Mouth: Mucous membranes are moist.      Pharynx: Oropharynx is clear. No oropharyngeal exudate or posterior oropharyngeal erythema.   Eyes:      General: No scleral icterus.        Right eye: No discharge.         Left eye: No discharge.      Extraocular Movements: Extraocular movements intact.      Conjunctiva/sclera: Conjunctivae normal.      Pupils: Pupils are equal, round, and reactive to light.   Neck:      Vascular: No carotid bruit.   Cardiovascular:      Rate and Rhythm: Normal rate and regular rhythm.      Pulses: Normal pulses.      Heart sounds: No  murmur heard.     No friction rub. No gallop.   Pulmonary:      Effort: Pulmonary effort is normal.      Breath sounds: Normal breath sounds. No wheezing, rhonchi or rales.   Abdominal:      General: Bowel sounds are normal.      Palpations: Abdomen is soft. There is no mass.      Tenderness: There is no abdominal tenderness. There is no guarding.   Musculoskeletal:         General: Deformity present. No swelling or tenderness. Normal range of motion.      Cervical back: Neck supple. No rigidity.      Right lower leg: No edema.      Left lower leg: No edema.      Comments: +slight deformity and scarring of right lateral ankle from previous orthopedic surgery   Lymphadenopathy:      Cervical: No cervical adenopathy.   Skin:     General: Skin is warm and dry.      Findings: No rash.   Neurological:      General: No focal deficit present.      Mental Status: She is alert and oriented to person, place, and time.   Psychiatric:         Mood and Affect: Mood normal.         Behavior: Behavior  normal.         Thought Content: Thought content normal.        PHQ:      03/22/2024     1:31 PM   PHQ-9    Little interest or pleasure in doing things 0   Feeling down, depressed, or hopeless 0   PHQ-2 Score 0   PHQ-9 Total Score 0       Orders Only on 03/18/2024   Component Date Value Ref Range Status    Vit D, 25-Hydroxy 03/18/2024 28.2 (L)  30.0 - 90.0 ng/mL Final    Vitamin B-12 03/18/2024 357  232 - 1245 pg/mL Final    Comment: Effective 11/20/16 Vitamin B12 Methodology Change - Vitamin B12 Reference Range  has been revised.      TSH 03/18/2024 2.480  0.358 - 3.740 mcIU/mL Final    Comment: TSH INTERPRETATION:    Controversy exists over an acceptable TSH range. Some experts argue that a  narrower reference range is better and will increase the detection of thyroid  disease, particularly marginal hypothyroidism.      Albumin Urine 03/18/2024 <0.3  0.0 - 20.0 mg/dL Final    Comment: Notes/References:    This range represents a general estimate for most normal patients. More  specific ranges are not firmly established due to variability in urinary  albumin excretion. Refer to Microalbumin/Creatinine Ratio for long term  monitoring.      Creatinine, Ur 03/18/2024 169.0  28.0 - 217.0 mg/dL Final    Albumin/Creatinine Ratio 03/18/2024 NOTE (A)  0 - 30 mg/g Cr Final    Comment: Unable to Calculate  Notes/References:     Microalbuminuria  = 30-300 mg/24hrs   Overt Proteinuria > 300 mg/24hrs     Follow-up testing on 2nd and 3rd, first morning specimens, is recommended to  confirm positive results.      Cholesterol, Total 03/18/2024 168  100 - 200 mg/dL Final    Comment: The National Cholesterol Education Program has published reference  cholesterol values for cardiovascular risk to be:    Less than 200  mg/dL     = Low Risk    295 to 239 mg/dL        = Borderline Risk    240mg /dL and greater    = High Risk      HDL 03/18/2024 42 (L)  >=50 mg/dL Final    Comment: The National Lipid Association and the [employer] Cholesterol  Education Program  (NCEP) have set the guidelines for high-density lipoprotein (HDL) cholesterol  in adults ages 45 and up.      Triglycerides 03/18/2024 105  0 - 149 mg/dL Final    Comment:   TRIGLYCERIDE INTERPRETATION:                          Recommended Fasting Triglyc Levels for Adults                        =============================================                        Desirable                        < 150 mg/dL                        Average                          < 200 mg/dL                        Borderline High             200 to 500 mg/dL                        Hypertriglyceridemic             > 500 mg/dL                        =============================================      LDL Cholesterol 03/18/2024 105.0 (H)  0.0 - 100.0 mg/dL Final    Comment: [facility] utilize the [employer] equation for calculating LDL.    LDL = Total Cholesterol - HDL Cholesterol - (Triglyceride/5)      LDL/HDL Ratio 03/18/2024 2.5   Final    Chol/HDL Ratio 03/18/2024 4.0  0.0 - 4.4 Final    VLDL 03/18/2024 21.0  5.0 - 40.0 mg/dL Final    Hemoglobin A2Z 03/18/2024 5.8  4.0 - 6.0 % Final    Comment: HEMOGLOBIN A1C INTERPRETATION:    The following arbitrary ranges may be used for interpretation of the results.  However, factors such as duration of diabetes, adherence to therapy, and  patient age should also be considered in assessing degree of blood glucose  control.    Hemoglobin A1C                 Avg. Blood Sugar  --------------------------------------------------------------  6%                           135 mg/dL  7%                           170 mg/dL  8%  205 mg/dL  9%                           240 mg/dL  16%                          275 mg/dL    ======================================================    A1C                      Glucose Control  ----------------------------------------------------------------  < 6.0 %                   Normal  6.0 - 6.9 %                Abnormal  7.0 - 7.9 %               Sub-Optimal Control  > 8.0 %                   Inadequate Control      Estimated Avg Glucose 03/18/2024 120   Final    Estimated Avg Glucose 03/18/2024 129   Final    Sodium 03/18/2024 141  135 - 145 mmol/L Final    Potassium 03/18/2024 4.2  3.5 - 5.3 mmol/L Final    Chloride 03/18/2024 105  98 - 107 mmol/L Final    CO2 03/18/2024 23  22 - 29 mmol/L Final    Glucose 03/18/2024 95  70 - 99 mg/dL Final    BUN 10/96/0454 8  6 - 20 mg/dL Final    Creatinine 09/81/1914 0.7  0.5 - 1.0 mg/dL Final    Anion Gap 78/29/5621 13  2 - 17 mmol/L Final    Osmolaliy Calculated 03/18/2024 279  270 - 287 mOsm/kg Final    Calcium 03/18/2024 9.0  8.5 - 10.7 mg/dL Final    Total Protein 03/18/2024 7.1  5.7 - 8.3 g/dL Final    Albumin 30/86/5784 4.4  3.5 - 5.2 g/dL Final    Globulin 69/62/9528 2.7  1.9 - 4.4 g/dL Final    Albumin/Globulin Ratio 03/18/2024 1.60  1.00 - 2.70 Final    Total Bilirubin 03/18/2024 0.59  0.00 - 1.20 mg/dL Final    Alk Phosphatase 03/18/2024 51  35 - 117 unit/L Final    AST 03/18/2024 22  0 - 46 unit/L Final    ALT 03/18/2024 16  0 - 42 unit/L Final    Est, Glom Filt Rate 03/18/2024 111  >=60 mL/min/1.72m Final    Comment: VERIFIED by Discern Expert.  GFR Interpretation:                                                                         % OF  KIDNEY  GFR                                                        STAGE  FUNCTION  ==================================================================================    > 90  Normal kidney function                       STAGE 1  90-100%  89 to 60      Mild loss of kidney function                 STAGE 2  80-60%  59 to 45      Mild to moderate loss of kidney function     STAGE 3a  59-45%  44 to 30      Moderate to severe loss of kidney function   STAGE 3b  44-30%  29 to 15      Severe loss of kidney function               STAGE 4  29-15%    < 15        Kidney failure                               STAGE  5  <15%  ==================================================================================  Modified from National Kidney Foundation    GFR Calculation performed using the CKD-EPI 2021 equation developed for use  with IDMS traceable creatinine methods and                            is the calculation recommended by  the [facility] for estimating GFR in adults.      WBC 03/18/2024 4.3  3.8 - 10.6 x10e3/mcL Final    RBC 03/18/2024 4.60  3.60 - 5.20 x10e6/mcL Final    Hemoglobin 03/18/2024 12.2  11.5 - 15.7 g/dL Final    Hematocrit 81/19/1478 38.7  34.0 - 47.0 % Final    MCV 03/18/2024 84.1  81.0 - 99.0 fL Final    MCH 03/18/2024 26.5 (L)  27.0 - 34.5 pg Final    MCHC 03/18/2024 31.5  30.0 - 36.0 g/dL Final    RDW 29/56/2130 15.1  10.0 - 17.0 % Final    Platelets 03/18/2024 255  140 - 440 x10e3/mcL Final    MPV 03/18/2024 11.1  7.0 - 12.2 fL Final    NRBC Automated 03/18/2024 0.0  0.0 - 0.2 % Final    NRBC Absolute 03/18/2024 0.000  0.000 - 0.012 x10e3/mcL Final    Neutrophils % 03/18/2024 63.0  42.0 - 74.0 % Final    Lymphocytes 03/18/2024 30.3  15.0 - 45.0 % Final    Monocytes % 03/18/2024 5.4  4.0 - 12.0 % Final    Eosinophils % 03/18/2024 0.9  0.0 - 7.0 % Final    Basophils % 03/18/2024 0.2  0.0 - 2.0 % Final    Neutrophils Absolute 03/18/2024 2.7  1.6 - 7.3 x10e3/mcL Final    Lymphocytes Absolute 03/18/2024 1.3  1.0 - 3.2 x10e3/mcL Final    Monocytes Absolute 03/18/2024 0.2 (L)  0.3 - 1.0 x10e3/mcL Final    Eosinophils Absolute 03/18/2024 0.0  0.0 - 0.5 x10e3/mcL Final    Basophils Absolute 03/18/2024 0.0  0.0 - 0.2 x10e3/mcL Final    Immature Granulocytes % 03/18/2024 0.2  0.0 - 0.6 % Final    Immature Grans (Abs) 03/18/2024 0.01  0.00 - 0.06 x10e3/mcL Final      IMPRESSION/PLAN:    1. Encounter for well adult exam with abnormal findings  Up-to-date on age-appropriate cancer screenings and vaccines    2. Vitamin B  12 deficiency  Levels therapeutic, continue current supplementation dosing  -  cyanocobalamin 1000 MCG tablet; Take 1 tablet by mouth daily  Dispense: 100 tablet; Refill: 3  - Vitamin B12; Future    3. Vitamin D deficiency  Continue current supplementation dosing  - vitamin D (ERGOCALCIFEROL) 1.25 MG (50000 UT) CAPS capsule; Take 1 capsule by mouth once a week  Dispense: 12 capsule; Refill: 3  - Vitamin D 25 Hydroxy; Future    4. Iron deficiency anemia secondary to inadequate dietary iron intake  Hemoglobin stable at 12.2  Recommend ferrous sulfate twice weekly, daily is no longer required  - CBC with Auto Differential; Future    5. Prediabetes  A1c currently 5.8  Encouraged low carbohydrate diet and regular aerobic exercise  - Hemoglobin A1C; Future    6. Injury of right ankle, sequela  Referred to orthopedics for evaluation at patient's request  - [facility] - Roxanne Mins MD, Orthopaedics MPH [facility] [address]    7. Hyperlipidemia LDL goal <100  - Lipid Panel; Future    8. Need for hepatitis B screening test  - Hepatitis B Surface Antibody; Future    9. BMI 22.0-22.9, adult    10. Negative depression screening    Celine Mans Tiena Manansala, DO    This note was typed and/or dictated by Reubin Milan Naturally Speaking at the point-of-care.  Reasonable attempt at proofreading has been made; however, occasional wrong-word or `sound-a-like substitutions persist due to the inherent limitations of voice recognition software.  Please notify the Thereasa Parkin if any discrepancies are noted or if the meaning of any statement is unclear.

## 2024-03-23 ENCOUNTER — Ambulatory Visit: Admit: 2024-03-23 | Discharge: 2024-03-23 | Payer: PRIVATE HEALTH INSURANCE

## 2024-03-23 ENCOUNTER — Ambulatory Visit: Admit: 2024-03-23 | Discharge: 2024-03-23 | Payer: PRIVATE HEALTH INSURANCE | Attending: Medical

## 2024-03-23 DIAGNOSIS — Z8781 Personal history of (healed) traumatic fracture: Secondary | ICD-10-CM

## 2024-03-23 DIAGNOSIS — M25571 Pain in right ankle and joints of right foot: Secondary | ICD-10-CM

## 2024-03-23 NOTE — Progress Notes (Signed)
 Orthopedic Surgery  Phone: [phone]   Provider: Henrene Hawking, PA-C  Date: 03/23/2024    Patient Name: Meredith Patterson, Age: 43 y.o., Sex: female, DOB: Mar 24, 1981  Address: [address]  [place] [address]   Phone: [phone] (home)   PCP: Peyton Najjar, DO   Insurance: Payor: Advertising copywriter / Plan: [employer] - CHOICE PLUS / Product Type: *No Product type* /     Chief Complaint   New Patient (RIGHT ANKLE )      Subjective   History of Present Illness  43 year old female with history of right bimalleolar ankle fracture ORIF. ORIF performed by Dr. August Saucer in [place], [place], on 07/27/2014. Annual/biannual monitoring due to internal hardware (screws and plates). No discomfort reported. Mild swelling after prolonged walking. No analgesics taken for ankle pain.    SOCIAL HISTORY  Exercise: Walking       Past Medical History:   Diagnosis Date    Hyperlipidemia     Prediabetes     Vitamin D deficiency          Current Outpatient Medications:     cyanocobalamin 1000 MCG tablet, Take 1 tablet by mouth daily, Disp: 100 tablet, Rfl: 3    vitamin D (ERGOCALCIFEROL) 1.25 MG (50000 UT) CAPS capsule, Take 1 capsule by mouth once a week, Disp: 12 capsule, Rfl: 3    ferrous gluconate (FERGON) 324 (38 Fe) MG tablet, Take 1 tablet by mouth daily (with breakfast) (Patient not taking: Reported on 03/22/2024), Disp: 100 tablet, Rfl: 3     Past Surgical History:   Procedure Laterality Date    ANKLE SURGERY Right 07/2015    CESAREAN SECTION      DILATION AND CURETTAGE OF UTERUS      TUBAL LIGATION      WISDOM TOOTH EXTRACTION         No Known Allergies     Social History     Occupational History    Not on file   Tobacco Use    Smoking status: Never    Smokeless tobacco: Never   Vaping Use    Vaping status: Never Used   Substance and Sexual Activity    Alcohol use: Never    Drug use: Never    Sexual activity: Yes     Partners: Male     Family history  She indicated that her mother is alive. She indicated that her father is  alive. She indicated that her maternal grandmother is deceased. She indicated that her maternal grandfather is deceased. She indicated that her paternal grandmother is deceased. She indicated that her paternal grandfather is deceased.    Reviewed and not pertinent to orthopedic problems.    Review of Systems  General: denies chills or fever, denies weight loss, denies night sweats  Cardiovascular: denies chest pain, denies dyspnea on exertion, denies edema, denies palpitations, denies rapid heart rate  Respiratory: denies cough, denies hemoptysis, denies shortness of breath, denies sputum changes, denies wheezing   Gastrointestinal: denies abdominal pain, denies blood in stools, denies diarrhea, denies hematemesis, denies melena, denies nausea/vomiting  Neurological: denies behavioral changes, denies confusion, denies impaired coordination/balance, denies numbness/tingling, denies weakness  Hematological: denies bleeding problems, denies blood clots    Objective   GENERAL APPEARANCE: well developed, well nourished, in no acute distress, Patient is A&O.   HEAD: normocephalic, atraumatic.   EYES: pupils equal, round, sclera non-icteric.   EARS: normal.   NECK: neck supple, full range of motion.   HEART: regular  rate and rhythm.   LUNGS: normal,good air movement.   ABDOMEN: soft, nontender.   Ortho Exam  Physical Exam  Right Ankle exam  Skin: Old incisions well healed  Palpation: nontender to palpation  Range of motion: full in all planes  Strength: 5/5 in all planes  Neurovascular: neurovascularly intact distally  Talar tilt: negative  Anterior drawer: negative  Klegers: negative  Syndesmotic squeeze: negative    Imaging    XR ANKLE RIGHT (MIN 3 VIEWS) (CLINIC PERFORMED)  Right ankle XR, 3 views, 03/23/24: There is hardware from a prior   bimalleolar ankle fracture.  Lateral plate and screws and medial screws   are intact and in good position.  Fractures are well healed.    Images were independently reviewed by  myself.     Assessment & Plan   1. History of fracture of right ankle  -     XR ANKLE RIGHT (MIN 3 VIEWS) (CLINIC PERFORMED)    Assessment & Plan  1. Hx of ORIF for right bimalleolar ankle fracture:  Radiographic imaging shows satisfactory healing, no hardware complications, no significant arthritis.  Pt is pain free with good function of the ankle.  She wanted to have XR to ensure no problems with the hardware.  Plan: Continue ankle strengthening exercises with resistance bands. Retain internal hardware unless problematic due to risks of removal. No further x-rays needed unless issues arise. Occasional discomfort normal post-injury.    Patient to follow-up sooner with any problems, questions, concerns, or worsening symptoms.    Orders Placed This Encounter    XR ANKLE RIGHT (MIN 3 VIEWS) (CLINIC PERFORMED)     EXAM 3 - PATIENT IS IN WAITING ROOM       Electronically signed by Rochel Brome, PA on 03/24/2024 at 1:26 PM    The patient (or guardian, if applicable) and other individuals in attendance with the patient were advised that Artificial Intelligence will be utilized during this visit to record, process the conversation to generate a clinical note, and support improvement of the AI technology. The patient (or guardian, if applicable) and other individuals in attendance at the appointment consented to the use of AI, including the recording.

## 2024-04-20 ENCOUNTER — Encounter

## 2024-06-03 ENCOUNTER — Inpatient Hospital Stay: Admit: 2024-06-03 | Payer: PRIVATE HEALTH INSURANCE

## 2024-06-03 DIAGNOSIS — Z1231 Encounter for screening mammogram for malignant neoplasm of breast: Secondary | ICD-10-CM

## 2024-08-11 ENCOUNTER — Ambulatory Visit: Admit: 2024-08-11 | Discharge: 2024-08-11 | Payer: PRIVATE HEALTH INSURANCE | Attending: Obstetrics & Gynecology

## 2024-08-11 NOTE — Progress Notes (Signed)
 PATIENT NAME:  Meredith Patterson     DATE OF BIRTH: 05/07/1981    REASON FOR VISIT:    Subjective   Meredith Patterson is here for GYN exam.  Pap done 07/2023 was normal.  She has had tubal ligation. Menses q 27 days. She has been well and has no complaints. Pap guidelines discussed and because she knows people with cancer she wants to continue yearly pap.     LMP: Patient's last menstrual period was 08/04/2024 (approximate).       OB History   Gravida Para Term Preterm AB Living   2 2 2       SAB IAB Ectopic Molar Multiple Live Births        2      # Outcome Date GA Lbr Len/2nd Weight Sex Type Anes PTL Lv   2 Term      CS-LTranv      1 Term      CS-LTranv              Past Medical History:   Diagnosis Date    Hyperlipidemia     Prediabetes     Vitamin D  deficiency             Past Surgical History:   Procedure Laterality Date    ANKLE SURGERY Right 07/2015    CESAREAN SECTION      DILATION AND CURETTAGE OF UTERUS      TUBAL LIGATION      WISDOM TOOTH EXTRACTION             Family History   Problem Relation Age of Onset    Diabetes Father            Social History     Socioeconomic History    Marital status: Married     Spouse name: Not on file    Number of children: Not on file    Years of education: Not on file    Highest education level: Not on file   Occupational History    Not on file   Tobacco Use    Smoking status: Never    Smokeless tobacco: Never   Vaping Use    Vaping status: Never Used   Substance and Sexual Activity    Alcohol use: Never    Drug use: Never    Sexual activity: Yes     Partners: Male   Other Topics Concern    Not on file   Social History Narrative    Not on file     Social Drivers of Health     Financial Resource Strain: Not on file   Food Insecurity: Not on file   Transportation Needs: Not on file   Physical Activity: Not on file   Stress: Not on file   Social Connections: Not on file   Intimate Partner Violence: Not on file   Housing Stability: Not on file           No Known Allergies        Review of  Systems    Constitutional:  No fevers or chills    Neuro:  No headaches, seizure activity    HEENT: no visual changes, bleeding gums    CV: No chest pain or palpitations    Resp: No SOB or cough    Breast:  No masses, pain, D/C, bleeding    GI:  No nausea/vomiting/diarrhea/constipation    GU: No dysuria or hematuria    MS:  No back,  point pain    Gyn:  No menstrual complaints, pelvic pain    Psych:  No depression, anxiety      Objective       BP 126/85   Ht 1.651 m (5' 5)   Wt 62.6 kg (138 lb)   LMP 08/04/2024 (Approximate)   BMI 22.96 kg/m    Body mass index is 22.96 kg/m.       Physical Exam    General:  well developed, well nourished, in no acute distress     Head:   normocephalic and atraumatic     Neck:  no masses, thyromegaly, or abnormal cervical nodes     Chest Wall:  no deformities     Breasts: breast symetrical w/o masses,skin changes,dimpling,or nipple discharge     Lungs:  clear bilaterally with normal respirations     Heart:   regular rate and rhythm, S1, S2 without murmurs     Abdomen:  bowel sounds positive; abdomen soft and non-tender without masses, organomegaly, or hernias noted     Extremities: no clubbing, cyanosis, edema, or deformity noted with normal full range of motion of all joints     Neurologic:  no focal deficits,normal coordination, muscle strength and tone     Skin:   intact without lesions or rashes     Psych:  alert and cooperative; normal mood and affect; normal attention span and concentration    Pelvic Exam:       External: normal female genitalia without lesions or masses       Vagina: normal without lesions or masses         Urethra: normal      Urethral Meatus: normal       Bladder: normal       Cervix: normal without lesions or masses       Adnexa: normal bimanual exam without masses or fullness       Uterus: Normal size non tender      Pap smear: performed       Assessment/Plan     Meredith Patterson was seen today for annual exam.    Diagnoses and all orders for this  visit:    Women's annual routine gynecological examination    Screening for malignant neoplasm of cervix  -     PAP IG, Aptima HPV and rfx 16/18,45 ([id number])

## 2024-09-24 ENCOUNTER — Encounter

## 2024-09-24 NOTE — Progress Notes (Deleted)
 [facility]  Meredith Jamse Ester, DO   [address] [place] [address]  Phone:  [phone]  Fax:  [phone]    CHIEF COMPLAINT:  No chief complaint on file.       HISTORY OF PRESENT ILLNESS:  Ms. Navejas is a 42 y.o. female  who presents for follow-up.  Supplementing vitamin D  for deficiency and levels are therapeutic.  Iron deficiency anemia has resolved on oral iron supplementation.  Supplementing vitamin B12 for deficiency and levels are therapeutic.  Working on a low carbohydrate diet for prediabetes.    REVIEW OF SYSTEMS:  Review of Systems   Constitutional:  Negative for chills and fever.   HENT:  Negative for congestion, postnasal drip, sneezing and sore throat.    Eyes:  Negative for discharge, itching and visual disturbance.   Respiratory:  Negative for cough, chest tightness and shortness of breath.    Cardiovascular:  Negative for chest pain and palpitations.   Gastrointestinal:  Negative for abdominal pain, constipation and diarrhea.   Endocrine: Negative.    Genitourinary:  Negative for dysuria and hematuria.   Musculoskeletal:  Negative for arthralgias and joint swelling.   Skin:  Negative for pallor and rash.   Allergic/Immunologic: Negative for environmental allergies.   Neurological:  Negative for dizziness and headaches.   Hematological: Negative.    Psychiatric/Behavioral:  Negative for dysphoric mood. The patient is not nervous/anxious.       PHYSICAL EXAM:  Vital Signs:  There were no vitals filed for this visit.     Physical Exam  Vitals and nursing note reviewed.   Constitutional:       General: She is not in acute distress.     Appearance: Normal appearance.   HENT:      Head: Normocephalic and atraumatic.      Right Ear: External ear normal. There is no impacted cerumen.      Left Ear: External ear normal. There is no impacted cerumen.      Nose: Nose normal. No congestion or rhinorrhea.      Mouth/Throat:      Mouth: Mucous membranes are moist.       Pharynx: Oropharynx is clear. No oropharyngeal exudate or posterior oropharyngeal erythema.   Eyes:      General: No scleral icterus.        Right eye: No discharge.         Left eye: No discharge.      Extraocular Movements: Extraocular movements intact.      Conjunctiva/sclera: Conjunctivae normal.      Pupils: Pupils are equal, round, and reactive to light.   Neck:      Vascular: No carotid bruit.   Cardiovascular:      Rate and Rhythm: Normal rate and regular rhythm.      Pulses: Normal pulses.      Heart sounds: No murmur heard.     No friction rub. No gallop.   Pulmonary:      Effort: Pulmonary effort is normal.      Breath sounds: Normal breath sounds. No wheezing, rhonchi or rales.   Abdominal:      General: Bowel sounds are normal.      Palpations: Abdomen is soft. There is no mass.      Tenderness: There is no abdominal tenderness. There is no guarding.   Musculoskeletal:         General: No swelling or tenderness. Normal  range of motion.      Cervical back: Neck supple. No rigidity.      Right lower leg: No edema.      Left lower leg: No edema.   Lymphadenopathy:      Cervical: No cervical adenopathy.   Skin:     General: Skin is warm and dry.      Findings: No rash.   Neurological:      General: No focal deficit present.      Mental Status: She is alert and oriented to person, place, and time.   Psychiatric:         Mood and Affect: Mood normal.         Behavior: Behavior normal.         Thought Content: Thought content normal.        PHQ:      08/11/2024    10:05 AM   PHQ-9    Little interest or pleasure in doing things 0   Feeling down, depressed, or hopeless 0   PHQ-2 Score 0   PHQ-9 Total Score 0       Office Visit on 08/11/2024   Component Date Value Ref Range Status    Diagnosis: 08/11/2024 Comment   Final    NEGATIVE FOR INTRAEPITHELIAL LESION OR MALIGNANCY.    Specimen adequacy: 08/11/2024 Comment   Final    Comment: Satisfactory for evaluation. Endocervical and/or squamous metaplastic  cells  (endocervical component) are present.      Clinician Provided ICD 08/11/2024 Comment   Final    Z12.4    Performed by: 08/11/2024 Comment   Final    Kandi Key, Cytologist (ASCP)    . 08/11/2024 .   Final    Note: 08/11/2024 Comment   Final    Comment: The Pap smear is a screening test designed to aid in the detection of  premalignant and malignant conditions of the uterine cervix.  It is not a  diagnostic procedure and should not be used as the sole means of detecting  cervical cancer.  Both false-positive and false-negative reports do occur.      Methodology: 08/11/2024 Comment   Final    Comment: This liquid based ThinPrep(R) pap test was interpreted using the  Hologic(R) Genius(TM) Cervical Algorithm whole slide imaging system.      HPV Aptima 08/11/2024 Negative  Negative Final    Comment: This nucleic acid amplification test detects fourteen high-risk  HPV types (16,18,31,33,35,39,45,51,52,56,58,59,66,68) without  differentiation.      HPV Genotype Reflex 08/11/2024 Comment   Final    Criteria not met, HPV Genotype not performed.        The ASCVD Risk score (Arnett DK, et al., 2019) failed to calculate for the following reasons:    Unable to determine if patient is Non-Hispanic African American     IMPRESSION/PLAN:  There are no diagnoses linked to this encounter.      Meredith Gutting Declan Mier, DO    This note was typed and/or dictated by Dragon Naturally Speaking at the point-of-care.  Reasonable attempt at proofreading has been made; however, occasional wrong-word or `sound-a-like substitutions persist due to the inherent limitations of voice recognition software.  Please notify the dino if any discrepancies are noted or if the meaning of any statement is unclear.

## 2024-10-04 ENCOUNTER — Encounter

## 2024-10-04 LAB — LIPID PANEL
Chol/HDL Ratio: 3.9 (ref 0.0–4.4)
Cholesterol, Total: 183 mg/dL (ref 100–200)
HDL: 47 mg/dL — ABNORMAL LOW (ref >=50)
LDL Cholesterol: 107 mg/dL — ABNORMAL HIGH (ref 0.0–100.0)
LDL/HDL Ratio: 2.3
Triglycerides: 145 mg/dL (ref 0–149)
VLDL: 29 mg/dL (ref 5.0–40.0)

## 2024-10-04 LAB — CBC WITH AUTO DIFFERENTIAL
Basophils %: 0.2 % (ref 0.0–2.0)
Basophils Absolute: 0 x10e3/mcL (ref 0.0–0.2)
Eosinophils %: 1.2 % (ref 0.0–7.0)
Eosinophils Absolute: 0.1 x10e3/mcL (ref 0.0–0.5)
Hematocrit: 40.1 % (ref 34.0–47.0)
Hemoglobin: 12.6 g/dL (ref 11.5–15.7)
Immature Grans (Abs): 0.01 x10e3/mcL (ref 0.00–0.06)
Immature Granulocytes %: 0.2 % (ref 0.0–0.6)
Lymphocytes Absolute: 1.3 x10e3/mcL (ref 1.0–3.2)
Lymphocytes: 32.6 % (ref 15.0–45.0)
MCH: 26.8 pg — ABNORMAL LOW (ref 27.0–34.5)
MCHC: 31.4 g/dL (ref 30.0–36.0)
MCV: 85.3 fL (ref 81.0–99.0)
MPV: 11.5 fL (ref 7.0–12.2)
Monocytes %: 5.1 % (ref 4.0–12.0)
Monocytes Absolute: 0.2 x10e3/mcL — ABNORMAL LOW (ref 0.3–1.0)
NRBC Absolute: 0 x10e3/mcL (ref 0.000–0.012)
NRBC Automated: 0 % (ref 0.0–0.2)
Neutrophils %: 60.7 % (ref 42.0–74.0)
Neutrophils Absolute: 2.5 x10e3/mcL (ref 1.6–7.3)
Platelets: 244 x10e3/mcL (ref 140–440)
RBC: 4.7 x10e6/mcL (ref 3.60–5.20)
RDW: 14.6 % (ref 10.0–17.0)
WBC: 4.1 x10e3/mcL (ref 3.8–10.6)

## 2024-10-04 LAB — VITAMIN B12: Vitamin B-12: 336 pg/mL (ref 232–1245)

## 2024-10-04 LAB — HEPATITIS B SURFACE ANTIBODY
Hep B S Ab Interp: NON-IMMUNE/NOT IMMUNE
Hep B S Ab: <3.5 — ABNORMAL LOW (ref 10.0–999.0)

## 2024-10-04 LAB — HEMOGLOBIN A1C
Estimated Avg Glucose: 123
Estimated Avg Glucose: 133
Hemoglobin A1C: 5.9 % (ref 4.0–6.0)

## 2024-10-04 LAB — VITAMIN D 25 HYDROXY: Vit D, 25-Hydroxy: 34.3 ng/mL (ref 30.0–90.0)

## 2024-10-13 ENCOUNTER — Ambulatory Visit: Admit: 2024-10-13 | Discharge: 2024-10-13 | Payer: PRIVATE HEALTH INSURANCE

## 2024-10-13 VITALS — BP 116/72 | HR 94 | Ht 65.0 in | Wt 139.6 lb

## 2024-10-13 DIAGNOSIS — D508 Other iron deficiency anemias: Principal | ICD-10-CM

## 2024-10-13 MED ORDER — VITAMIN D (ERGOCALCIFEROL) 1.25 MG (50000 UT) PO CAPS
1.25 | ORAL_CAPSULE | ORAL | 3 refills | 84.00000 days | Status: AC
Start: 2024-10-13 — End: 2025-09-14

## 2024-10-13 NOTE — Progress Notes (Signed)
 "[facility]  Nat Jamse Ester, DO   [address], [place] [address]  Phone:  [phone]  Fax:  [phone]    CHIEF COMPLAINT:  Chief Complaint   Patient presents with    6 Month Follow-Up        HISTORY OF PRESENT ILLNESS:  Ms. Slight is a 43 y.o. female  who presents for follow-up.  Supplementing vitamin D  for deficiency and levels are therapeutic.  Iron deficiency anemia has resolved on oral iron supplementation.  Supplementing vitamin B12 for deficiency and levels are therapeutic.  Working on a low carbohydrate diet for prediabetes and A1c is currently 5.9.  Due for influenza vaccine today.  This week's labs demonstrate no immunity to hepatitis B and patient would like to get her first hepatitis B vaccine today as well.    REVIEW OF SYSTEMS:  Review of Systems   Constitutional:  Negative for chills and fever.   HENT:  Negative for congestion, postnasal drip, sneezing and sore throat.    Eyes:  Negative for discharge, itching and visual disturbance.   Respiratory:  Negative for cough, chest tightness and shortness of breath.    Cardiovascular:  Negative for chest pain and palpitations.   Gastrointestinal:  Negative for abdominal pain, constipation and diarrhea.   Endocrine: Negative.    Genitourinary:  Negative for dysuria and hematuria.   Musculoskeletal:  Negative for arthralgias and joint swelling.   Skin:  Negative for pallor and rash.   Allergic/Immunologic: Negative for environmental allergies.   Neurological:  Negative for dizziness and headaches.   Hematological: Negative.    Psychiatric/Behavioral:  Negative for dysphoric mood. The patient is not nervous/anxious.       PHYSICAL EXAM:  Vital Signs:  Vitals:    10/13/24 0945   BP: 116/72   Pulse: 94   SpO2: 98%   Weight: 63.3 kg (139 lb 9.6 oz)   Height: 1.651 m (5' 5)        Physical Exam  Vitals and nursing note reviewed.   Constitutional:       General: She is not in acute distress.     Appearance: Normal  appearance.   HENT:      Head: Normocephalic and atraumatic.      Right Ear: External ear normal. There is no impacted cerumen.      Left Ear: External ear normal. There is no impacted cerumen.      Nose: Nose normal. No congestion or rhinorrhea.      Mouth/Throat:      Mouth: Mucous membranes are moist.      Pharynx: Oropharynx is clear. No oropharyngeal exudate or posterior oropharyngeal erythema.   Eyes:      General: No scleral icterus.        Right eye: No discharge.         Left eye: No discharge.      Extraocular Movements: Extraocular movements intact.      Conjunctiva/sclera: Conjunctivae normal.      Pupils: Pupils are equal, round, and reactive to light.   Neck:      Vascular: No carotid bruit.   Cardiovascular:      Rate and Rhythm: Normal rate and regular rhythm.      Pulses: Normal pulses.      Heart sounds: No murmur heard.     No friction rub. No gallop.   Pulmonary:      Effort: Pulmonary effort is normal.  Breath sounds: Normal breath sounds. No wheezing, rhonchi or rales.   Abdominal:      General: Bowel sounds are normal.      Palpations: Abdomen is soft. There is no mass.      Tenderness: There is no abdominal tenderness. There is no guarding.   Musculoskeletal:         General: No swelling or tenderness. Normal range of motion.      Cervical back: Neck supple. No rigidity.      Right lower leg: No edema.      Left lower leg: No edema.   Lymphadenopathy:      Cervical: No cervical adenopathy.   Skin:     General: Skin is warm and dry.      Findings: No rash.   Neurological:      General: No focal deficit present.      Mental Status: She is alert and oriented to person, place, and time.   Psychiatric:         Mood and Affect: Mood normal.         Behavior: Behavior normal.         Thought Content: Thought content normal.        PHQ:      10/13/2024    10:39 AM   PHQ-9    Little interest or pleasure in doing things 0   Feeling down, depressed, or hopeless 0   PHQ-2 Score 0   PHQ-9 Total Score  0       Orders Only on 10/04/2024   Component Date Value Ref Range Status    Hep B S Ab 10/04/2024 <3.5 (L)  10.0 - 999.0 Final    Hep B S Ab Interp 10/04/2024 Non-Immune   Final    Vit D, 25-Hydroxy 10/04/2024 34.3  30.0 - 90.0 ng/mL Final    Vitamin B-12 10/04/2024 336  232 - 1245 pg/mL Final    Comment: Effective 11/20/16 Vitamin B12 Methodology Change - Vitamin B12 Reference Range  has been revised.      Cholesterol, Total 10/04/2024 183  100 - 200 mg/dL Final    Comment: The National Cholesterol Education Program has published reference  cholesterol values for cardiovascular risk to be:    Less than 200 mg/dL     = Low Risk    799 to 239 mg/dL        = Borderline Risk    240mg /dL and greater    = High Risk      HDL 10/04/2024 47 (L)  >=50 mg/dL Final    Comment: The National Lipid Association and the [employer] Cholesterol Education Program  (NCEP) have set the guidelines for high-density lipoprotein (HDL) cholesterol  in adults ages 94 and up.      Triglycerides 10/04/2024 145  0 - 149 mg/dL Final    Comment:   TRIGLYCERIDE INTERPRETATION:                          Recommended Fasting Triglyc Levels for Adults                        =============================================                        Desirable                        < 150 mg/dL  Average                          < 200 mg/dL                        Borderline High             200 to 500 mg/dL                        Hypertriglyceridemic             > 500 mg/dL                        =============================================      LDL Cholesterol 10/04/2024 107.0 (H)  0.0 - 100.0 mg/dL Final    Comment: [facility] utilize the [employer] equation for calculating LDL.    LDL = Total Cholesterol - HDL Cholesterol - (Triglyceride/5)      LDL/HDL Ratio 10/04/2024 2.3   Final    Chol/HDL Ratio 10/04/2024 3.9  0.0 - 4.4 Final    VLDL 10/04/2024 29.0  5.0 - 40.0 mg/dL Final    Hemoglobin J8R 10/04/2024 5.9  4.0 - 6.0 % Final     Comment: HEMOGLOBIN A1C INTERPRETATION:    The following arbitrary ranges may be used for interpretation of the results.  However, factors such as duration of diabetes, adherence to therapy, and  patient age should also be considered in assessing degree of blood glucose  control.    Hemoglobin A1C                 Avg. Blood Sugar  --------------------------------------------------------------  6%                           135 mg/dL  7%                           170 mg/dL  8%                           205 mg/dL  9%                           240 mg/dL  89%                          275 mg/dL    ======================================================    A1C                      Glucose Control  ----------------------------------------------------------------  < 6.0 %                   Normal  6.0 - 6.9 %               Abnormal  7.0 - 7.9 %               Sub-Optimal Control  > 8.0 %                   Inadequate Control      Estimated Avg Glucose 10/04/2024 123   Final    Estimated Avg Glucose 10/04/2024 133   Final    WBC 10/04/2024 4.1  3.8 - 10.6 x10e3/mcL Final    RBC 10/04/2024 4.70  3.60 - 5.20 x10e6/mcL Final    Hemoglobin 10/04/2024 12.6  11.5 - 15.7 g/dL Final    Hematocrit 89/79/7974 40.1  34.0 - 47.0 % Final    MCV 10/04/2024 85.3  81.0 - 99.0 fL Final    MCH 10/04/2024 26.8 (L)  27.0 - 34.5 pg Final    MCHC 10/04/2024 31.4  30.0 - 36.0 g/dL Final    RDW 89/79/7974 14.6  10.0 - 17.0 % Final    Platelets 10/04/2024 244  140 - 440 x10e3/mcL Final    MPV 10/04/2024 11.5  7.0 - 12.2 fL Final    NRBC Automated 10/04/2024 0.0  0.0 - 0.2 % Final    NRBC Absolute 10/04/2024 0.000  0.000 - 0.012 x10e3/mcL Final    Neutrophils % 10/04/2024 60.7  42.0 - 74.0 % Final    Lymphocytes 10/04/2024 32.6  15.0 - 45.0 % Final    Monocytes % 10/04/2024 5.1  4.0 - 12.0 % Final    Eosinophils % 10/04/2024 1.2  0.0 - 7.0 % Final    Basophils % 10/04/2024 0.2  0.0 - 2.0 % Final    Neutrophils Absolute 10/04/2024 2.5  1.6 - 7.3 x10e3/mcL  Final    Lymphocytes Absolute 10/04/2024 1.3  1.0 - 3.2 x10e3/mcL Final    Monocytes Absolute 10/04/2024 0.2 (L)  0.3 - 1.0 x10e3/mcL Final    Eosinophils Absolute 10/04/2024 0.1  0.0 - 0.5 x10e3/mcL Final    Basophils Absolute 10/04/2024 0.0  0.0 - 0.2 x10e3/mcL Final    Immature Granulocytes % 10/04/2024 0.2  0.0 - 0.6 % Final    Immature Grans (Abs) 10/04/2024 0.01  0.00 - 0.06 x10e3/mcL Final   Office Visit on 08/11/2024   Component Date Value Ref Range Status    Diagnosis: 08/11/2024 Comment   Final    NEGATIVE FOR INTRAEPITHELIAL LESION OR MALIGNANCY.    Specimen adequacy: 08/11/2024 Comment   Final    Comment: Satisfactory for evaluation. Endocervical and/or squamous metaplastic  cells (endocervical component) are present.      Clinician Provided ICD 08/11/2024 Comment   Final    Z12.4    Performed by: 08/11/2024 Comment   Final    Kandi Key, Cytologist (ASCP)    . 08/11/2024 .   Final    Note: 08/11/2024 Comment   Final    Comment: The Pap smear is a screening test designed to aid in the detection of  premalignant and malignant conditions of the uterine cervix.  It is not a  diagnostic procedure and should not be used as the sole means of detecting  cervical cancer.  Both false-positive and false-negative reports do occur.      Methodology: 08/11/2024 Comment   Final    Comment: This liquid based ThinPrep(R) pap test was interpreted using the  Hologic(R) Genius(TM) Cervical Algorithm whole slide imaging system.      HPV Aptima 08/11/2024 Negative  Negative Final    Comment: This nucleic acid amplification test detects fourteen high-risk  HPV types (16,18,31,33,35,39,45,51,52,56,58,59,66,68) without  differentiation.      HPV Genotype Reflex 08/11/2024 Comment   Final    Criteria not met, HPV Genotype not performed.      IMPRESSION/PLAN:    1. Iron deficiency anemia secondary to inadequate dietary iron intake  Resolved on daily oral iron supplementation, will continue to monitor  - CBC with Auto  Differential; Future    2. Vitamin B 12  deficiency  Levels therapeutic, continue current supplementation dosing  - Vitamin B12; Future    3. Vitamin D  deficiency  Continue vitamin D3 50,000 units weekly for deficiency  - vitamin D  (ERGOCALCIFEROL ) 1.25 MG (50000 UT) CAPS capsule; Take 1 capsule by mouth once a week  Dispense: 12 capsule; Refill: 3  - Vitamin D  25 Hydroxy; Future    4. Prediabetes  A1c currently 5.9  Encouraged low carbohydrate diet more regular aerobic exercise  Will recheck A1c in 6 months to reevaluate the need for metformin  - Comprehensive Metabolic Panel; Future  - Hemoglobin A1C; Future  - Albumin/Creatinine Ratio, Urine; Future    5. Hyperlipidemia LDL goal <100  Encouraged low-fat diet and regular aerobic exercise  Will continue to monitor  - Comprehensive Metabolic Panel; Future  - Lipid Panel; Future  - TSH reflex to FT4; Future    6. Need for influenza vaccination  - Influenza, FLUCELVAX Trivalent, (age 92 mo+) IM, Preservative Free, 0.5mL    7. Need for hepatitis B vaccination  - Hep B, ENGERIX-B, (age 56 yrs+), IM, 1mL, 3-dose    Immunizations Administered       Name Date Dose Route    Hep B, ENGERIX-B, RECOMBIVAX-HB, (age 20y+), IM, 1mL 10/13/2024 1 mL Intramuscular    Site: Left arm    Lot: [id number]    NDC: 41839-178-56    Influenza, FLUCELVAX, (age 92 mo+) IM, Trivalent PF, 0.5mL 10/13/2024 0.5 mL Intramuscular    Site: Right arm    Lot: [id number]    NDC: 29538-344-95          Nat Gutting Lolly Glaus, DO    This note was typed and/or dictated by [employer] Naturally Speaking at the point-of-care.  Reasonable attempt at proofreading has been made; however, occasional wrong-word or `sound-a-like substitutions persist due to the inherent limitations of voice recognition software.  Please notify the dino if any discrepancies are noted or if the meaning of any statement is unclear.  "
